# Patient Record
Sex: Male | Born: 1980 | Race: Black or African American | Hispanic: No | Marital: Single | Smoking: Current some day smoker
Health system: Southern US, Community
[De-identification: ages and names within clinical notes are randomized; demographics above are authoritative.]

## PROBLEM LIST (undated history)

## (undated) DIAGNOSIS — K5720 Diverticulitis of large intestine with perforation and abscess without bleeding: Secondary | ICD-10-CM

## (undated) DIAGNOSIS — Z91199 Patient's noncompliance with other medical treatment and regimen due to unspecified reason: Secondary | ICD-10-CM

## (undated) DIAGNOSIS — F1411 Cocaine abuse, in remission: Secondary | ICD-10-CM

## (undated) DIAGNOSIS — W3400XA Accidental discharge from unspecified firearms or gun, initial encounter: Secondary | ICD-10-CM

## (undated) DIAGNOSIS — Z9119 Patient's noncompliance with other medical treatment and regimen: Secondary | ICD-10-CM

## (undated) HISTORY — PX: NO PAST SURGERIES: SHX2092

---

## 2004-12-18 ENCOUNTER — Emergency Department: Payer: Self-pay | Admitting: Unknown Physician Specialty

## 2012-03-06 ENCOUNTER — Emergency Department: Payer: Self-pay | Admitting: Emergency Medicine

## 2013-02-19 ENCOUNTER — Emergency Department: Payer: Self-pay | Admitting: Emergency Medicine

## 2013-03-23 ENCOUNTER — Emergency Department: Payer: Self-pay | Admitting: Emergency Medicine

## 2015-06-02 ENCOUNTER — Emergency Department
Admission: EM | Admit: 2015-06-02 | Discharge: 2015-06-02 | Discharge status: Against Medical Advice | Payer: Self-pay | Attending: Student | Admitting: Student

## 2015-06-02 ENCOUNTER — Encounter: Payer: Self-pay | Admitting: Emergency Medicine

## 2015-06-02 DIAGNOSIS — R42 Dizziness and giddiness: Secondary | ICD-10-CM | POA: Insufficient documentation

## 2015-06-02 DIAGNOSIS — R1030 Lower abdominal pain, unspecified: Secondary | ICD-10-CM | POA: Insufficient documentation

## 2015-06-02 DIAGNOSIS — Z720 Tobacco use: Secondary | ICD-10-CM | POA: Insufficient documentation

## 2015-06-02 LAB — URINALYSIS COMPLETE WITH MICROSCOPIC (ARMC ONLY)
Bacteria, UA: NONE SEEN
Bilirubin Urine: NEGATIVE
Glucose, UA: NEGATIVE mg/dL
KETONES UR: NEGATIVE mg/dL
Leukocytes, UA: NEGATIVE
Nitrite: NEGATIVE
PH: 6 (ref 5.0–8.0)
PROTEIN: NEGATIVE mg/dL
SPECIFIC GRAVITY, URINE: 1.025 (ref 1.005–1.030)
Squamous Epithelial / LPF: NONE SEEN

## 2015-06-02 LAB — CBC WITH DIFFERENTIAL/PLATELET
Basophils Absolute: 0 10*3/uL (ref 0–0.1)
Basophils Relative: 0 %
EOS ABS: 0.1 10*3/uL (ref 0–0.7)
Eosinophils Relative: 1 %
HEMATOCRIT: 45.9 % (ref 40.0–52.0)
HEMOGLOBIN: 15.4 g/dL (ref 13.0–18.0)
LYMPHS ABS: 1.4 10*3/uL (ref 1.0–3.6)
LYMPHS PCT: 10 %
MCH: 29.3 pg (ref 26.0–34.0)
MCHC: 33.5 g/dL (ref 32.0–36.0)
MCV: 87.3 fL (ref 80.0–100.0)
Monocytes Absolute: 1.2 10*3/uL — ABNORMAL HIGH (ref 0.2–1.0)
Monocytes Relative: 9 %
NEUTROS ABS: 11.3 10*3/uL — AB (ref 1.4–6.5)
NEUTROS PCT: 80 %
Platelets: 191 10*3/uL (ref 150–440)
RBC: 5.25 MIL/uL (ref 4.40–5.90)
RDW: 13.3 % (ref 11.5–14.5)
WBC: 14.1 10*3/uL — AB (ref 3.8–10.6)

## 2015-06-02 LAB — BASIC METABOLIC PANEL
Anion gap: 5 (ref 5–15)
BUN: 15 mg/dL (ref 6–20)
CHLORIDE: 103 mmol/L (ref 101–111)
CO2: 28 mmol/L (ref 22–32)
Calcium: 8.9 mg/dL (ref 8.9–10.3)
Creatinine, Ser: 1.41 mg/dL — ABNORMAL HIGH (ref 0.61–1.24)
GFR calc Af Amer: >60 mL/min (ref >60)
GFR calc non Af Amer: >60 mL/min (ref >60)
Glucose, Bld: 98 mg/dL (ref 65–99)
POTASSIUM: 3.4 mmol/L — AB (ref 3.5–5.1)
SODIUM: 136 mmol/L (ref 135–145)

## 2015-06-02 NOTE — ED Notes (Signed)
Pt presents ED with c/o lower abdominal pain since last night associated with light headedness. Pt denies nausea/vomiting or dark/bloody stool.

## 2015-06-04 ENCOUNTER — Emergency Department
Admission: EM | Admit: 2015-06-04 | Discharge: 2015-06-04 | Disposition: A | Discharge status: Home / Self Care | Payer: Self-pay | Attending: Emergency Medicine | Admitting: Emergency Medicine

## 2015-06-04 ENCOUNTER — Telehealth: Payer: Self-pay | Admitting: Emergency Medicine

## 2015-06-04 ENCOUNTER — Encounter: Payer: Self-pay | Admitting: Emergency Medicine

## 2015-06-04 ENCOUNTER — Emergency Department: Payer: Self-pay

## 2015-06-04 DIAGNOSIS — R509 Fever, unspecified: Secondary | ICD-10-CM | POA: Insufficient documentation

## 2015-06-04 DIAGNOSIS — Z720 Tobacco use: Secondary | ICD-10-CM | POA: Insufficient documentation

## 2015-06-04 DIAGNOSIS — K5900 Constipation, unspecified: Secondary | ICD-10-CM | POA: Insufficient documentation

## 2015-06-04 LAB — CBC WITH DIFFERENTIAL/PLATELET
BASOS ABS: 0 10*3/uL (ref 0–0.1)
Basophils Relative: 0 %
EOS PCT: 1 %
Eosinophils Absolute: 0.1 10*3/uL (ref 0–0.7)
HCT: 45 % (ref 40.0–52.0)
Hemoglobin: 15 g/dL (ref 13.0–18.0)
Lymphocytes Relative: 3 %
Lymphs Abs: 0.3 10*3/uL — ABNORMAL LOW (ref 1.0–3.6)
MCH: 29.1 pg (ref 26.0–34.0)
MCHC: 33.4 g/dL (ref 32.0–36.0)
MCV: 87.3 fL (ref 80.0–100.0)
Monocytes Absolute: 0.4 10*3/uL (ref 0.2–1.0)
Monocytes Relative: 4 %
NEUTROS ABS: 9.8 10*3/uL — AB (ref 1.4–6.5)
Neutrophils Relative %: 92 %
PLATELETS: 163 10*3/uL (ref 150–440)
RBC: 5.16 MIL/uL (ref 4.40–5.90)
RDW: 13.5 % (ref 11.5–14.5)
WBC: 10.6 10*3/uL (ref 3.8–10.6)

## 2015-06-04 LAB — COMPREHENSIVE METABOLIC PANEL
ALK PHOS: 45 U/L (ref 38–126)
ALT: 28 U/L (ref 17–63)
AST: 23 U/L (ref 15–41)
Albumin: 3.8 g/dL (ref 3.5–5.0)
Anion gap: 5 (ref 5–15)
BUN: 13 mg/dL (ref 6–20)
CALCIUM: 8.7 mg/dL — AB (ref 8.9–10.3)
CHLORIDE: 104 mmol/L (ref 101–111)
CO2: 28 mmol/L (ref 22–32)
CREATININE: 1.45 mg/dL — AB (ref 0.61–1.24)
GFR calc Af Amer: >60 mL/min (ref >60)
GFR calc non Af Amer: >60 mL/min (ref >60)
GLUCOSE: 103 mg/dL — AB (ref 65–99)
Potassium: 3.7 mmol/L (ref 3.5–5.1)
SODIUM: 137 mmol/L (ref 135–145)
Total Bilirubin: 0.8 mg/dL (ref 0.3–1.2)
Total Protein: 7.4 g/dL (ref 6.5–8.1)

## 2015-06-04 MED ORDER — BISACODYL 5 MG PO TBEC: 5.0000 mg | DELAYED_RELEASE_TABLET | Freq: Every day | ORAL | Status: DC | PRN

## 2015-06-04 MED ORDER — IBUPROFEN 800 MG PO TABS
800.0000 mg | ORAL_TABLET | Freq: Once | ORAL | Status: AC
  Administered 2015-06-04: 800 mg via ORAL
  Filled 2015-06-04: qty 1

## 2015-06-04 MED ORDER — SODIUM CHLORIDE 0.9 % IV BOLUS (SEPSIS)
1000.0000 mL | Freq: Once | INTRAVENOUS | Status: AC
  Administered 2015-06-04: 1000 mL via INTRAVENOUS

## 2015-06-04 NOTE — ED Notes (Signed)
AAOx3.  Skin warm and dry.  NAD.  D/C home 

## 2015-06-04 NOTE — ED Notes (Signed)
Called patient due to lwot to inquire about condition and follow up plans. Patient says he feels some better today, but says he still has soreness in abdomen and was thinking to return.  He has no pcp. i told him he can return for exam.

## 2015-06-04 NOTE — ED Notes (Addendum)
Pt to ed with c/o fever and body aches.  Pt states he was seen here Monday night for abd pain and fever states left before being seen.  Pt states went home and ha felt increasingly worse. Denies abd pain today. Denies vomiting, denies nausea, denies diarrhea.

## 2015-06-04 NOTE — Discharge Instructions (Signed)

## 2015-06-04 NOTE — ED Provider Notes (Signed)
[facility] Emergency Department Provider Note  ____________________________________________  Time seen: Approximately 3:35 PM  I have reviewed the triage vital signs and the nursing notes.   HISTORY  Chief Complaint Generalized Body Aches    HPI Adam Pugh is a 34 y.o. male who presents to the emergency department complaining of abdominal pain and fever. He states that he isn't to the emergency department 2 days prior for same complaints but due to weight times he left without being seen. He states that symptoms have continued over the past 2 days. Eats that the only medication he has taken for same has been DayQuil. He reports no relief with same. He denies any headache, neck pain, nasal congestion, sore throat, chest pain, shortness of breath, nausea, vomiting, diarrhea. He does endorse abdominal pain that has "traveled around in my abdomen. And some constipation. He states that pain initially was in his right lower quadrant that it is now primarily in his left lower quadrant. He denies any urinary symptoms of frequency, burning with urination, hematuria. Denies any blood in his stools. He states that he did have a bowel movement today but previously has not had one for "4-5 days".Patient states that he has been able to eat and drink as normal.   History reviewed. No pertinent past medical history.  There are no active problems to display for this patient.   History reviewed. No pertinent past surgical history.  Current Outpatient Rx  Name  Route  Sig  Dispense  Refill  . bisacodyl (DULCOLAX) 5 MG EC tablet   Oral   Take 1 tablet (5 mg total) by mouth daily as needed for moderate constipation.   30 tablet   0     Allergies Review of patient's allergies indicates no known allergies.  History reviewed. No pertinent family history.  Social History Social History  Substance Use Topics  . Smoking status: Current Every Day Smoker -- 0.25  packs/day    Types: Cigarettes  . Smokeless tobacco: Never Used  . Alcohol Use: Yes     Comment: occ    Review of Systems Constitutional: No fever/chills Eyes: No visual changes. ENT: No sore throat. Cardiovascular: Denies chest pain. Respiratory: Denies shortness of breath. Gastrointestinal: Endorses abdominal pain.  No nausea, no vomiting.  No diarrhea.  Endorses constipation. Genitourinary: Negative for dysuria. Musculoskeletal: Negative for back pain. Skin: Negative for rash. Neurological: Negative for headaches, focal weakness or numbness.  10-point ROS otherwise negative.  ____________________________________________   PHYSICAL EXAM:  VITAL SIGNS: ED Triage Vitals  Enc Vitals Group     BP 06/04/15 1450 130/79 mmHg     Pulse Rate 06/04/15 1450 107     Resp 06/04/15 1450 20     Temp 06/04/15 1450 101.8 F (38.8 C)     Temp Source 06/04/15 1450 Oral     SpO2 06/04/15 1450 100 %     Weight 06/04/15 1450 220 lb (99.791 kg)     Height 06/04/15 1450  (1.88 m)     Head Cir --      Peak Flow --      Pain Score 06/04/15 1525 3     Pain Loc --      Pain Edu? --      Excl. in GC? --     Constitutional: Alert and oriented. Well appearing and in no acute distress. Eyes: Conjunctivae are normal. PERRL. EOMI. Head: Atraumatic. Nose: No congestion/rhinnorhea. Mouth/Throat: Mucous membranes are moist.  Oropharynx  non-erythematous. Neck: No stridor.   Hematological/Lymphatic/Immunilogical: No cervical lymphadenopathy. Cardiovascular: Normal rate, regular rhythm. Grossly normal heart sounds.  Good peripheral circulation. Respiratory: Normal respiratory effort.  No retractions. Lungs CTAB. Gastrointestinal: Soft to palpation. Patient is tender to palpation left lower quadrant but is nontender to palpation of other quadrants. No tenderness to palpation in suprapubic region. No rigidity or guarding. No distention. No abdominal bruits. No CVA tenderness. Musculoskeletal: No  lower extremity tenderness nor edema.  No joint effusions. Neurologic:  Normal speech and language. No gross focal neurologic deficits are appreciated. No gait instability. Skin:  Skin is warm, dry and intact. No rash noted. Psychiatric: Mood and affect are normal. Speech and behavior are normal.  ____________________________________________   LABS (all labs ordered are listed, but only abnormal results are displayed)  Labs Reviewed  COMPREHENSIVE METABOLIC PANEL - Abnormal; Notable for the following:    Glucose, Bld 103 (*)    Creatinine, Ser 1.45 (*)    Calcium 8.7 (*)    All other components within normal limits  CBC WITH DIFFERENTIAL/PLATELET - Abnormal; Notable for the following:    Neutro Abs 9.8 (*)    Lymphs Abs 0.3 (*)    All other components within normal limits   ____________________________________________  EKG   ____________________________________________  RADIOLOGY  Abdominal x-ray Impression: No definitive obstruction identified. A few small air-fluid levels were noted within the colon and small bowel. Mild amount of fecal material is seen. ____________________________________________   PROCEDURES  Procedure(s) performed: None  Critical Care performed: No  ____________________________________________   INITIAL IMPRESSION / ASSESSMENT AND PLAN / ED COURSE  Pertinent labs & imaging results that were available during my care of the patient were reviewed by me and considered in my medical decision making (see chart for details).  The patient is a 34 year old male who presents to the emergency department complaining of abdominal pain. States that symptoms have persisted for 4 days and the pain is transient through his abdomen. Initially pain was in right lower quadrant and now the pain is in his left lower quadrant. He does endorse moderate constipation over the last several days, though he did have a bowel movement this morning. He reports a mild fever but  no other symptoms. Exam revealed tenderness to palpation over left lower quadrant. Patient was given ibuprofen, IV fluids for symptomatic relief. Repeat vital signs showed improvement after administration.. Labs returned without evidence of elevated white blood cell count. Abdominal x-ray did not reveal any obvious obstruction and there is a moderate amount of retained fecal material. Discussed findings with the patient. At this time I will not pursue a CT of his abdomen or pelvis and the patient agrees with same. Advised the patient to take laxatives for improvement of his constipation. Tylenol and ibuprofen for additional pain relief and any return of fever. ____________________________________________   FINAL CLINICAL IMPRESSION(S) / ED DIAGNOSES  Final diagnoses:  Constipation, unspecified constipation type      Racheal Patches, PA-C 06/04/15 1743  Arnaldo Natal, MD 06/05/15 [phone]

## 2015-08-03 ENCOUNTER — Emergency Department: Payer: Medicaid Other

## 2015-08-03 ENCOUNTER — Inpatient Hospital Stay
Admission: EM | Admit: 2015-08-03 | Discharge: 2015-08-08 | DRG: 386 | Disposition: A | Discharge status: Home Health | Payer: Medicaid Other | Attending: Internal Medicine | Admitting: Internal Medicine

## 2015-08-03 ENCOUNTER — Encounter: Payer: Self-pay | Admitting: Emergency Medicine

## 2015-08-03 DIAGNOSIS — K50913 Crohn's disease, unspecified, with fistula: Principal | ICD-10-CM | POA: Diagnosis present

## 2015-08-03 DIAGNOSIS — F1210 Cannabis abuse, uncomplicated: Secondary | ICD-10-CM | POA: Diagnosis present

## 2015-08-03 DIAGNOSIS — K5792 Diverticulitis of intestine, part unspecified, without perforation or abscess without bleeding: Secondary | ICD-10-CM

## 2015-08-03 DIAGNOSIS — N419 Inflammatory disease of prostate, unspecified: Secondary | ICD-10-CM | POA: Diagnosis present

## 2015-08-03 DIAGNOSIS — F1410 Cocaine abuse, uncomplicated: Secondary | ICD-10-CM | POA: Diagnosis present

## 2015-08-03 DIAGNOSIS — R1030 Lower abdominal pain, unspecified: Secondary | ICD-10-CM | POA: Insufficient documentation

## 2015-08-03 DIAGNOSIS — Z79899 Other long term (current) drug therapy: Secondary | ICD-10-CM | POA: Diagnosis not present

## 2015-08-03 DIAGNOSIS — R300 Dysuria: Secondary | ICD-10-CM | POA: Diagnosis not present

## 2015-08-03 DIAGNOSIS — F17210 Nicotine dependence, cigarettes, uncomplicated: Secondary | ICD-10-CM | POA: Diagnosis present

## 2015-08-03 DIAGNOSIS — K5720 Diverticulitis of large intestine with perforation and abscess without bleeding: Secondary | ICD-10-CM | POA: Diagnosis present

## 2015-08-03 DIAGNOSIS — E876 Hypokalemia: Secondary | ICD-10-CM | POA: Diagnosis not present

## 2015-08-03 LAB — COMPREHENSIVE METABOLIC PANEL
ALBUMIN: 4.2 g/dL (ref 3.5–5.0)
ALK PHOS: 48 U/L (ref 38–126)
ALT: 19 U/L (ref 17–63)
ANION GAP: 9 (ref 5–15)
AST: 16 U/L (ref 15–41)
BUN: 23 mg/dL — ABNORMAL HIGH (ref 6–20)
CALCIUM: 9 mg/dL (ref 8.9–10.3)
CO2: 23 mmol/L (ref 22–32)
Chloride: 105 mmol/L (ref 101–111)
Creatinine, Ser: 1.35 mg/dL — ABNORMAL HIGH (ref 0.61–1.24)
GFR calc non Af Amer: >60 mL/min (ref >60)
GLUCOSE: 159 mg/dL — AB (ref 65–99)
POTASSIUM: 4 mmol/L (ref 3.5–5.1)
SODIUM: 137 mmol/L (ref 135–145)
Total Bilirubin: 0.5 mg/dL (ref 0.3–1.2)
Total Protein: 7.4 g/dL (ref 6.5–8.1)

## 2015-08-03 LAB — URINALYSIS COMPLETE WITH MICROSCOPIC (ARMC ONLY)
BACTERIA UA: NONE SEEN
BILIRUBIN URINE: NEGATIVE
GLUCOSE, UA: NEGATIVE mg/dL
Ketones, ur: NEGATIVE mg/dL
Leukocytes, UA: NEGATIVE
NITRITE: NEGATIVE
PH: 6 (ref 5.0–8.0)
Protein, ur: NEGATIVE mg/dL
SPECIFIC GRAVITY, URINE: 1.03 (ref 1.005–1.030)
Squamous Epithelial / LPF: NONE SEEN

## 2015-08-03 LAB — CBC
HEMATOCRIT: 44.8 % (ref 40.0–52.0)
HEMOGLOBIN: 14.7 g/dL (ref 13.0–18.0)
MCH: 28.2 pg (ref 26.0–34.0)
MCHC: 32.7 g/dL (ref 32.0–36.0)
MCV: 86.2 fL (ref 80.0–100.0)
Platelets: 217 10*3/uL (ref 150–440)
RBC: 5.2 MIL/uL (ref 4.40–5.90)
RDW: 14.1 % (ref 11.5–14.5)
WBC: 18.2 10*3/uL — ABNORMAL HIGH (ref 3.8–10.6)

## 2015-08-03 LAB — CK: Total CK: 177 U/L (ref 49–397)

## 2015-08-03 LAB — TROPONIN I: Troponin I: <0.03 ng/mL (ref <0.031)

## 2015-08-03 LAB — LIPASE, BLOOD: LIPASE: 24 U/L (ref 11–51)

## 2015-08-03 LAB — C-REACTIVE PROTEIN: CRP: 0.8 mg/dL (ref <1.0)

## 2015-08-03 LAB — SEDIMENTATION RATE: SED RATE: 4 mm/h (ref 0–15)

## 2015-08-03 MED ORDER — ENOXAPARIN SODIUM 40 MG/0.4ML ~~LOC~~ SOLN
40.0000 mg | SUBCUTANEOUS | Status: DC
  Administered 2015-08-03 – 2015-08-07 (×5): 40 mg via SUBCUTANEOUS
  Filled 2015-08-03 (×5): qty 0.4

## 2015-08-03 MED ORDER — KETOROLAC TROMETHAMINE 30 MG/ML IJ SOLN
30.0000 mg | Freq: Once | INTRAMUSCULAR | Status: DC
  Filled 2015-08-03 (×2): qty 1

## 2015-08-03 MED ORDER — IOHEXOL 300 MG/ML  SOLN
125.0000 mL | Freq: Once | INTRAMUSCULAR | Status: AC | PRN
  Administered 2015-08-03: 125 mL via INTRAVENOUS

## 2015-08-03 MED ORDER — ACETAMINOPHEN 650 MG RE SUPP: 650.0000 mg | Freq: Four times a day (QID) | RECTAL | Status: DC | PRN

## 2015-08-03 MED ORDER — MORPHINE SULFATE (PF) 2 MG/ML IV SOLN
2.0000 mg | INTRAVENOUS | Status: DC | PRN
  Administered 2015-08-03 (×2): 2 mg via INTRAVENOUS
  Filled 2015-08-03 (×2): qty 1

## 2015-08-03 MED ORDER — ONDANSETRON HCL 4 MG/2ML IJ SOLN
4.0000 mg | Freq: Once | INTRAMUSCULAR | Status: AC
  Administered 2015-08-03: 4 mg via INTRAVENOUS
  Filled 2015-08-03: qty 2

## 2015-08-03 MED ORDER — SODIUM CHLORIDE 0.9 % IV BOLUS (SEPSIS)
1000.0000 mL | Freq: Once | INTRAVENOUS | Status: AC
  Administered 2015-08-03: 1000 mL via INTRAVENOUS

## 2015-08-03 MED ORDER — ONDANSETRON HCL 4 MG PO TABS: 4.0000 mg | ORAL_TABLET | Freq: Four times a day (QID) | ORAL | Status: DC | PRN

## 2015-08-03 MED ORDER — SODIUM CHLORIDE 0.9 % IV SOLN
INTRAVENOUS | Status: DC
Start: 2015-08-03 — End: 2015-08-07
  Administered 2015-08-03 – 2015-08-07 (×7): via INTRAVENOUS

## 2015-08-03 MED ORDER — IOHEXOL 240 MG/ML SOLN
25.0000 mL | Freq: Once | INTRAMUSCULAR | Status: AC | PRN
  Administered 2015-08-03: 25 mL via ORAL

## 2015-08-03 MED ORDER — ONDANSETRON HCL 4 MG/2ML IJ SOLN: 4.0000 mg | Freq: Four times a day (QID) | INTRAMUSCULAR | Status: DC | PRN

## 2015-08-03 MED ORDER — PIPERACILLIN-TAZOBACTAM 3.375 G IVPB
3.3750 g | Freq: Three times a day (TID) | INTRAVENOUS | Status: DC
  Administered 2015-08-03 – 2015-08-07 (×11): 3.375 g via INTRAVENOUS
  Filled 2015-08-03 (×14): qty 50

## 2015-08-03 MED ORDER — HYDROMORPHONE HCL 1 MG/ML IJ SOLN
1.0000 mg | Freq: Once | INTRAMUSCULAR | Status: AC
  Administered 2015-08-03: 1 mg via INTRAVENOUS
  Filled 2015-08-03: qty 1

## 2015-08-03 MED ORDER — MORPHINE SULFATE (PF) 4 MG/ML IV SOLN
4.0000 mg | INTRAVENOUS | Status: DC | PRN
  Administered 2015-08-04: 4 mg via INTRAVENOUS
  Filled 2015-08-03: qty 1

## 2015-08-03 MED ORDER — PIPERACILLIN-TAZOBACTAM 3.375 G IVPB
3.3750 g | Freq: Once | INTRAVENOUS | Status: AC
  Administered 2015-08-03: 3.375 g via INTRAVENOUS
  Filled 2015-08-03: qty 50

## 2015-08-03 MED ORDER — MORPHINE SULFATE (PF) 4 MG/ML IV SOLN
4.0000 mg | Freq: Once | INTRAVENOUS | Status: AC
  Administered 2015-08-03: 4 mg via INTRAVENOUS
  Filled 2015-08-03: qty 1

## 2015-08-03 MED ORDER — NICOTINE 10 MG IN INHA
1.0000 | RESPIRATORY_TRACT | Status: DC | PRN
  Filled 2015-08-03: qty 36

## 2015-08-03 MED ORDER — ACETAMINOPHEN 325 MG PO TABS
650.0000 mg | ORAL_TABLET | Freq: Four times a day (QID) | ORAL | Status: DC | PRN
  Administered 2015-08-05: 650 mg via ORAL
  Filled 2015-08-03: qty 2

## 2015-08-03 NOTE — ED Notes (Signed)
Patient transported to CT 

## 2015-08-03 NOTE — H&P (Signed)
[facility] [place] [facility]   PATIENT NAME: Adam Pugh    MR#:  [id number]  DATE OF BIRTH:  1981/01/27  DATE OF ADMISSION:  08/03/2015  PRIMARY CARE PHYSICIAN: No PCP Per Patient   REQUESTING/REFERRING PHYSICIAN: Dr. Sharyn Creamer  CHIEF COMPLAINT:   Chief Complaint  Patient presents with  . Abdominal Pain    HISTORY OF PRESENT ILLNESS:  Adam Pugh  is a 34 y.o. male with no known past medical history who presented to the hospital complaining of abdominal pain that began earlier this morning. Patient describes his abdominal pain in the lower part of his abdomen showed and crampy in nature. He associates this with some ongoing nausea but no acute vomiting. He denies diarrhea, hematochezia, melena, fever, chills or any sick contacts. Patient says he developed similar symptoms about a month and a half ago and he came to the ER but his symptoms this time are much severe.  Patient presented to the emergency room and underwent a CT scan of his abdomen pelvis which showed evidence of sigmoid diverticulitis/microperforation with fistula formation. Hospitalist services were contacted for further treatment and evaluation.  PAST MEDICAL HISTORY:  History reviewed. No pertinent past medical history.  PAST SURGICAL HISTORY:  History reviewed. No pertinent past surgical history.  SOCIAL HISTORY:   Social History  Substance Use Topics  . Smoking status: Current Every Day Smoker -- 0.25 packs/day    Types: Cigarettes  . Smokeless tobacco: Never Used  . Alcohol Use: Yes     Comment: occ    FAMILY HISTORY:   Family History  Problem Relation Age of Onset  . Glaucoma Father   . Diabetes Other     DRUG ALLERGIES:  No Known Allergies  REVIEW OF SYSTEMS:   Review of Systems  Constitutional: Negative for fever and weight loss.  HENT: Negative for congestion, nosebleeds and tinnitus.   Eyes: Negative for blurred vision, double vision and redness.   Respiratory: Negative for cough, hemoptysis and shortness of breath.   Cardiovascular: Negative for chest pain, orthopnea, leg swelling and PND.  Gastrointestinal: Positive for nausea and abdominal pain. Negative for vomiting, diarrhea and melena.  Genitourinary: Negative for dysuria, urgency and hematuria.  Musculoskeletal: Negative for joint pain and falls.  Neurological: Negative for dizziness, tingling, sensory change, focal weakness, seizures, weakness and headaches.  Endo/Heme/Allergies: Negative for polydipsia. Does not bruise/bleed easily.  Psychiatric/Behavioral: Negative for depression and memory loss. The patient is not nervous/anxious.     MEDICATIONS AT HOME:   Prior to Admission medications   Medication Sig Start Date End Date Taking? Authorizing Provider  bisacodyl (DULCOLAX) 5 MG EC tablet Take 1 tablet (5 mg total) by mouth daily as needed for moderate constipation. 06/04/15 06/03/16  Christiane Ha D Cuthriell, PA-C      VITAL SIGNS:  Height  (1.88 m), weight 104.327 kg (230 lb), SpO2 96 %.  PHYSICAL EXAMINATION:  Physical Exam  GENERAL:  34 y.o.-year-old patient lying in the bed in mild to moderate distress.  EYES: Pupils equal, round, reactive to light and accommodation. No scleral icterus. Extraocular muscles intact.  HEENT: Head atraumatic, normocephalic. Oropharynx and nasopharynx clear. No oropharyngeal erythema, moist oral mucosa  NECK:  Supple, no jugular venous distention. No thyroid enlargement, no tenderness.  LUNGS: Normal breath sounds bilaterally, no wheezing, rales, rhonchi. No use of accessory muscles of respiration.  CARDIOVASCULAR: S1, S2 RRR. No murmurs, rubs, gallops, clicks.  ABDOMEN: Soft, tender in the right lower and  left lower quadrant. No rebound, rigidity. Hypoactive Bowel sounds present. No organomegaly or mass.  EXTREMITIES: No pedal edema, cyanosis, or clubbing. + 2 pedal & radial pulses b/l.   NEUROLOGIC: Cranial nerves II through XII  are intact. No focal Motor or sensory deficits appreciated b/l PSYCHIATRIC: The patient is alert and oriented x 3. Good affect.  SKIN: No obvious rash, lesion, or ulcer.   LABORATORY PANEL:   CBC  Recent Labs Lab 08/03/15 1019  WBC 18.2*  HGB 14.7  HCT 44.8  PLT 217   ------------------------------------------------------------------------------------------------------------------  Chemistries   Recent Labs Lab 08/03/15 1019  NA 137  K 4.0  CL 105  CO2 23  GLUCOSE 159*  BUN 23*  CREATININE 1.35*  CALCIUM 9.0  AST 16  ALT 19  ALKPHOS 48  BILITOT 0.5   ------------------------------------------------------------------------------------------------------------------  Cardiac Enzymes No results for input(s): TROPONINI in the last 168 hours. ------------------------------------------------------------------------------------------------------------------  RADIOLOGY:  Ct Abdomen Pelvis W Contrast  08/03/2015  CLINICAL DATA:  Mid to lower right abdominal pain. EXAM: CT ABDOMEN AND PELVIS WITH CONTRAST TECHNIQUE: Multidetector CT imaging of the abdomen and pelvis was performed using the standard protocol following bolus administration of intravenous contrast. CONTRAST:  OMNIPAQUE IOHEXOL 300 MG/ML  SOLN COMPARISON:  No prior CT abdomen/ pelvis. 06/04/2015 abdominal radiographs. FINDINGS: Lower chest: No significant pulmonary nodules or acute consolidative airspace disease. Hepatobiliary: Normal liver with no liver mass. Normal gallbladder with no radiopaque cholelithiasis. No biliary ductal dilatation. Pancreas: Normal, with no mass or duct dilation. Spleen: Normal size. No mass. Adrenals/Urinary Tract: Normal adrenals. There a few hypodense subcentimeter renal cortical lesions in both kidneys, too small to characterize. No hydronephrosis. Normal bladder. Stomach/Bowel: Grossly normal stomach. There is severe wall thickening and hyperenhancement with prominent pericolonic  fat stranding involving the mid to distal sigmoid colon. There is underlying mild sigmoid diverticulosis. There are 2 small bowel loops in the anterior pelvis in close proximity to the inflamed sigmoid colon, both of which demonstrate prominent small bowel wall thickening and hyperenhancement and prominent surrounding fat stranding and ill-defined fluid. There is a suggestion of a hyperenhancing curvilinear tract with internal gas between the sigmoid colon and 1 of the inflamed pelvic small bowel loops, which could represent a colo-enteric fistula. There is a tiny focus of extraluminal gas in the anterior pelvis. No drainable fluid collection is seen. The appendix is normal. There is no significant small bowel dilatation. Vascular/Lymphatic: Normal caliber abdominal aorta. Patent portal, splenic, hepatic and renal veins. No pathologically enlarged lymph nodes in the abdomen or pelvis. Reproductive: Normal size prostate and seminal vesicles. Other: Trace free fluid in the prerectal pelvic space. Musculoskeletal: No aggressive appearing focal osseous lesions. Mild degenerative changes in the lower thoracic spine. IMPRESSION: 1. Prominent wall thickening, wall hyperenhancement and pericolonic fat stranding in the mid to distal sigmoid colon, in association with underlying mild sigmoid diverticulosis. Two adjacent pelvic small bowel loops demonstrate severe wall thickening and surrounding inflammatory fat stranding and fluid. 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 Tiny focus of pneumoperitoneum, with no focal drainable fluid collection. Differential includes Crohn disease versus perforated sigmoid diverticulitis with fistula formation. An underlying neoplasm is less likely but not entirely excluded. Surgical consultation is advised. 2. Normal appendix. Critical Value/emergent results were called by telephone at the time of interpretation on 08/03/2015 at  12:24 pm to Dr. Sharyn Creamer , who verbally acknowledged these results. Electronically Signed   By: Barbara Cower  A Poff M.D.   On: 08/03/2015 12:26     IMPRESSION AND PLAN:   34 year old male with no significant past medical history who presented to the hospital with abdominal pain and nausea and noted to have CT scan findings consistent with acute diverticulitis/microperforation with fistula formation.  #1 acute diverticulitis-this is the cause of patient's abdominal pain and nausea. -Supportive care with IV fluids, pain control, antiemetics. -I will also start the patient on IV Zosyn and follow clinically. -Patient's CT scan is also suggestive of possible Crohn's disease and therefore I will get a Gastroenterology consult. -Nothing by mouth except ice chips and meds.  #2 microperforation with fistula formation -the ER physician discussed these findings with the surgeon on call who did not recommend acute surgical intervention presently. -I will continue supportive care as mentioned above. I will go ahead and consult general surgery and have them follow along.  #3 leukocytosis-likely secondary to the acute diverticulitis and we'll follow white cell count with IV antibiotic therapy.  #4 polysubstance abuse-patient snorted cocaine yesterday and does smoke marijuana occasionally. -Follow for signs of withdrawal.   All the records are reviewed and case discussed with ED provider. Management plans discussed with the patient, family and they are in agreement.  CODE STATUS: Full  TOTAL TIME TAKING CARE OF THIS PATIENT: 45 minutes.    Houston Siren M.D on 08/03/2015 at 12:52 PM  Between 7am to 6pm - Pager - [phone]  After 6pm go to [email or web address] - password [email or web address] [facility]  [facility] [place] [facility]  Office  [phone]  CC: Primary care physician; No PCP Per Patient

## 2015-08-03 NOTE — Progress Notes (Signed)
Dr. Anne Hahn notified of pt requesting for nicotrol inhalers. Pt smokes 1/2 pack/day. MD placed order.

## 2015-08-03 NOTE — Consult Note (Signed)
Patient ID: Adam Pugh, male   DOB: 12-08-1980, 34 y.o.   MRN: [id number]  CC: ABDOMINAL PAIN  HPI Adam Pugh is a 33 y.o. male admitted to the medicine service for treatment of abdominal pain. Patient is without any significant medical history who had a sudden onset of abdominal pain earlier today. The pain was lower in his abdomen and described as crampy. It is coming in waves and will shoot through him across his lower abdomen. He's had associated nausea without vomiting for this. He did have subjective fevers and sweats at home this morning. He does state that he had something similar to this about 6 weeks ago but that is not as severe at that time. His last BM was yesterday and he reports that as being normal. He denies any chest pain, shortness of breath, anorexia, fatigue, or blood per rectum. Otherwise healthy.  HPI  History reviewed. No pertinent past medical history.  History reviewed. No pertinent past surgical history.  Family History  Problem Relation Age of Onset  . Glaucoma Father   . Diabetes Other     Social History Social History  Substance Use Topics  . Smoking status: Current Every Day Smoker -- 0.25 packs/day    Types: Cigarettes  . Smokeless tobacco: Never Used  . Alcohol Use: Yes     Comment: occ    No Known Allergies  Current Facility-Administered Medications  Medication Dose Route Frequency Provider Last Rate Last Dose  . 0.9 %  sodium chloride infusion   Intravenous Continuous Houston Siren, MD 125 mL/hr at 08/03/15 1537    . acetaminophen (TYLENOL) tablet 650 mg  650 mg Oral Q6H PRN Houston Siren, MD       Or  . acetaminophen (TYLENOL) suppository 650 mg  650 mg Rectal Q6H PRN Houston Siren, MD      . enoxaparin (LOVENOX) injection 40 mg  40 mg Subcutaneous Q24H Houston Siren, MD   40 mg at 08/03/15 1537  . morphine 2 MG/ML injection 2 mg  2 mg Intravenous Q3H PRN Houston Siren, MD      . ondansetron (ZOFRAN) tablet 4 mg  4 mg  Oral Q6H PRN Houston Siren, MD       Or  . ondansetron (ZOFRAN) injection 4 mg  4 mg Intravenous Q6H PRN Houston Siren, MD      . piperacillin-tazobactam (ZOSYN) IVPB 3.375 g  3.375 g Intravenous Once Sharyn Creamer, MD 12.5 mL/hr at 08/03/15 1321 3.375 g at 08/03/15 1321  . piperacillin-tazobactam (ZOSYN) IVPB 3.375 g  3.375 g Intravenous 3 times per day Olene Floss, RPH         Review of Systems A multi-point review of systems was asked and was negative except for the positive findings in the HPI  Physical Exam Blood pressure 121/88, pulse 101, temperature 98.7 F (37.1 C), temperature source Oral, resp. rate 18, height 6\' 2"  (1.88 m), weight 104.327 kg (230 lb), SpO2 100 %. CONSTITUTIONAL: Resting in bed in no acute distress. EYES: Pupils are equal, round, and reactive to light, Sclera are non-icteric. EARS, NOSE, MOUTH AND THROAT: The oropharynx is clear. The oral mucosa is pink and moist. Hearing is intact to voice. LYMPH NODES:  Lymph nodes in the neck are normal. RESPIRATORY:  Lungs are clear. There is normal respiratory effort, with equal breath sounds bilaterally, and without pathologic use of accessory muscles. CARDIOVASCULAR: Heart is regular without murmurs, gallops, or rubs. GI:  The abdomen is slender, soft, tender to palpation in the lower midline, and nondistended. There are no palpable masses. There is no hepatosplenomegaly. There are normal bowel sounds in all quadrants. GU: Rectal deferred.   MUSCULOSKELETAL: Normal muscle strength and tone. No cyanosis or edema.   SKIN: Turgor is good and there are no pathologic skin lesions or ulcers. NEUROLOGIC: Motor and sensation is grossly normal. Cranial nerves are grossly intact. PSYCH:  Oriented to person, place and time. Affect is normal.  Data Reviewed I reviewed the patient's labs and images. Labs are concerning for leukocytosis of 18,000 images with concerns for either new-onset Crohn's disease or diverticulitis both  with microperforation possible fistula formation. I have personally reviewed the patient's imaging, laboratory findings and medical records.    Assessment    Inflammatory bowel disease    Plan    34 year old male with no other medical history with new onset inflammatory bowel disease. Difficult to ascertain as to whether not this is diverticulitis versus new-onset Crohn's. Agree with current medical management of IV antibiotics and bowel rest. Patient will need further GI workup. Should he worsen clinically, form an abscess or peritonitis then surgery may be required. Currently no evidence of peritonitis or indication for urgent surgical intervention. Surgery will continue to follow until he clinically improves.     Time spent with the patient was 40 minutes, with more than 50% of the time spent in face-to-face education, counseling and care coordination.     Ricarda Frame, MD FACS General Surgeon 08/03/2015, 4:26 PM

## 2015-08-03 NOTE — Consult Note (Signed)
ANTIBIOTIC CONSULT NOTE - INITIAL  Pharmacy Consult for zosyn Indication: diverticulitis/microperforation with fistula formation.  No Known Allergies  Patient Measurements: Height:  (188 cm) Weight: 230 lb (104.327 kg) IBW/kg (Calculated) : 82.2 Adjusted Body Weight:   Vital Signs: Temp: 98.7 F (37.1 C) (12/04 1345) Temp Source: Oral (12/04 1345) BP: 121/88 mmHg (12/04 1345) Pulse Rate: 101 (12/04 1345) Intake/Output from previous day:   Intake/Output from this shift: Total I/O In: -  Out: 500 [Urine:500]  Labs:  Recent Labs  08/03/15 1019  WBC 18.2*  HGB 14.7  PLT 217  CREATININE 1.35*   Estimated Creatinine Clearance: 101.1 mL/min (by C-G formula based on Cr of 1.35). No results for input(s): VANCOTROUGH, VANCOPEAK, VANCORANDOM, GENTTROUGH, GENTPEAK, GENTRANDOM, TOBRATROUGH, TOBRAPEAK, TOBRARND, AMIKACINPEAK, AMIKACINTROU, AMIKACIN in the last 72 hours.   Microbiology: No results found for this or any previous visit (from the past 720 hour(s)).  Medical History: History reviewed. No pertinent past medical history.  Medications:  Scheduled:  . enoxaparin (LOVENOX) injection  40 mg Subcutaneous Q24H  . piperacillin-tazobactam (ZOSYN)  IV  3.375 g Intravenous Once  . piperacillin-tazobactam (ZOSYN)  IV  3.375 g Intravenous 3 times per day   Assessment: Pt is a 34 year old male with diverticulitis/microperforation with fistula formation. Pharmacy consulted to dose zosyn   Goal of Therapy:  resolution of infection  Plan:  Follow up culture results zosyn 3.375g q 8 hours EI dosing. pharmacy to continue to monitor. thank you for the consult  Adam Pugh Adam Pugh 08/03/2015,2:12 PM

## 2015-08-03 NOTE — ED Notes (Addendum)
Pt arrived via EMS from. States the pain started around 0700 this morning.  Denies any history of abdominal pain or medical problems.  Pt has not ate since last night. Denies any N/V. No change in position makes things better or worse.  Denies back pain. Pt reports diaphoresis this morning when this started.

## 2015-08-03 NOTE — ED Provider Notes (Signed)
[facility] Emergency Department Provider Note  REMINDER - THIS NOTE IS NOT A FINAL MEDICAL RECORD UNTIL IT IS SIGNED. UNTIL THEN, THE CONTENT BELOW MAY REFLECT INFORMATION FROM A DOCUMENTATION TEMPLATE, NOT THE ACTUAL PATIENT VISIT. ____________________________________________  Time seen: Approximately 10:52 AM  I have reviewed the triage vital signs and the nursing notes.   HISTORY  Chief Complaint Abdominal Pain    HPI Adam Pugh is a 34 y.o. male with no previous medical history.  Patient reports about 3 hours ago while at home he developed sudden onset and has been having progressively increasing pain in the lower abdomen slightly more on the right than on the left. No nausea or vomiting. No fevers or chills. Nothing seems to make it better, it is made worse by bumps in the road.  Patient states is a sharp pain, worsening, and severe. Denies pain in his testicles or groin. No chest pain or trouble breathing. He does report that he used cocaine last night. He does not use heavy or large alcohol.   History reviewed. No pertinent past medical history.  Patient Active Problem List   Diagnosis Date Noted  . Diverticulitis 08/03/2015    History reviewed. No pertinent past surgical history.  No current outpatient prescriptions on file.  Allergies Review of patient's allergies indicates no known allergies.  Family History  Problem Relation Age of Onset  . Glaucoma Father   . Diabetes Other     Social History Social History  Substance Use Topics  . Smoking status: Current Every Day Smoker -- 0.25 packs/day    Types: Cigarettes  . Smokeless tobacco: Never Used  . Alcohol Use: Yes     Comment: occ    Review of Systems Constitutional: No fever/chills Eyes: No visual changes. ENT: No sore throat. Cardiovascular: Denies chest pain. Respiratory: Denies shortness of breath. Gastrointestinal: No nausea, no vomiting.  No diarrhea.  No  constipation. Genitourinary: Negative for dysuria. No blood in the urine. Musculoskeletal: Negative for back pain. Skin: Negative for rash. Neurological: Negative for headaches, focal weakness or numbness.  10-point ROS otherwise negative.  ____________________________________________   PHYSICAL EXAM:  VITAL SIGNS: ED Triage Vitals  Enc Vitals Group     BP --      Pulse --      Resp --      Temp --      Temp src --      SpO2 08/03/15 1011 96 %     Weight 08/03/15 1014 230 lb (104.327 kg)     Height 08/03/15 1014  (1.88 m)     Head Cir --      Peak Flow --      Pain Score 08/03/15 1014 10     Pain Loc --      Pain Edu? --      Excl. in GC? --    Constitutional: Alert and oriented. Well appearing but does appear to be in moderate discomfort, sitting still reporting that any movement will make his pain worse in the abdomen. Eyes: Conjunctivae are normal. PERRL. EOMI. Head: Atraumatic. Nose: No congestion/rhinnorhea. Mouth/Throat: Mucous membranes are moist.  Oropharynx non-erythematous. Neck: No stridor.   Cardiovascular: Normal rate, regular rhythm. Grossly normal heart sounds.  Good peripheral circulation. Respiratory: Normal respiratory effort.  No retractions. Lungs CTAB. Gastrointestinal: Mild tenderness throughout the abdomen, but focal and severe tenderness in the right lower abdomen with associated rebound and guarding. There is pain at McBurney's point. Negative  Eulah Pont. No distention. No abdominal bruits. No CVA tenderness. Normal testicles, nontender and descended bilaterally. Normal circumcised penis. No groin pain or mass. No evidence of groin hernia. Musculoskeletal: No lower extremity tenderness nor edema.  No joint effusions. Neurologic:  Normal speech and language. No gross focal neurologic deficits are appreciated. No gait instability. Skin:  Skin is warm, dry and intact. No rash noted. Psychiatric: Mood and affect are normal. Speech and behavior are  normal.  ____________________________________________   LABS (all labs ordered are listed, but only abnormal results are displayed)  Labs Reviewed  COMPREHENSIVE METABOLIC PANEL - Abnormal; Notable for the following:    Glucose, Bld 159 (*)    BUN 23 (*)    Creatinine, Ser 1.35 (*)    All other components within normal limits  CBC - Abnormal; Notable for the following:    WBC 18.2 (*)    All other components within normal limits  URINALYSIS COMPLETEWITH MICROSCOPIC ([facility] ONLY) - Abnormal; Notable for the following:    Color, Urine YELLOW (*)    APPearance CLEAR (*)    Hgb urine dipstick 2+ (*)    All other components within normal limits  CULTURE, BLOOD (ROUTINE X 2)  CULTURE, BLOOD (ROUTINE X 2)  LIPASE, BLOOD  CK  TROPONIN I  SEDIMENTATION RATE  C-REACTIVE PROTEIN   ____________________________________________  EKG  ED ECG REPORT I, QUALE, MARK, the attending physician, personally viewed and interpreted this ECG.  Date: 08/03/2015 EKG Time: 1050 Rate: 80 Rhythm: normal sinus rhythm QRS Axis: Probable LVH Intervals: normal ST/T Wave abnormalities: normal Conduction Disutrbances: none Narrative Interpretation: Normal sinus rhythm, possible LVH. No ischemic abdomen round  ____________________________________________  RADIOLOGY  CT Abdomen Pelvis W Contrast (Final result) Result time: 08/03/15 12:26:13   Final result by Rad Results In Interface (08/03/15 12:26:13)   Narrative:   CLINICAL DATA: Mid to lower right abdominal pain.  EXAM: CT ABDOMEN AND PELVIS WITH CONTRAST  TECHNIQUE: Multidetector CT imaging of the abdomen and pelvis was performed using the standard protocol following bolus administration of intravenous contrast.  CONTRAST: OMNIPAQUE IOHEXOL 300 MG/ML SOLN  COMPARISON: No prior CT abdomen/ pelvis. 06/04/2015 abdominal radiographs.  FINDINGS: Lower chest: No significant pulmonary nodules or acute consolidative airspace  disease.  Hepatobiliary: Normal liver with no liver mass. Normal gallbladder with no radiopaque cholelithiasis. No biliary ductal dilatation.  Pancreas: Normal, with no mass or duct dilation.  Spleen: Normal size. No mass.  Adrenals/Urinary Tract: Normal adrenals. There a few hypodense subcentimeter renal cortical lesions in both kidneys, too small to characterize. No hydronephrosis. Normal bladder.  Stomach/Bowel: Grossly normal stomach. There is severe wall thickening and hyperenhancement with prominent pericolonic fat stranding involving the mid to distal sigmoid colon. There is underlying mild sigmoid diverticulosis. There are 2 small bowel loops in the anterior pelvis in close proximity to the inflamed sigmoid colon, both of which demonstrate prominent small bowel wall thickening and hyperenhancement and prominent surrounding fat stranding and ill-defined fluid. There is a suggestion of a hyperenhancing curvilinear tract with internal gas between the sigmoid colon and 1 of the inflamed pelvic small bowel loops, which could represent a colo-enteric fistula. There is a tiny focus of extraluminal gas in the anterior pelvis. No drainable fluid collection is seen. The appendix is normal. There is no significant small bowel dilatation.  Vascular/Lymphatic: Normal caliber abdominal aorta. Patent portal, splenic, hepatic and renal veins. No pathologically enlarged lymph nodes in the abdomen or pelvis.  Reproductive: Normal size prostate and seminal  vesicles.  Other: Trace free fluid in the prerectal pelvic space.  Musculoskeletal: No aggressive appearing focal osseous lesions. Mild degenerative changes in the lower thoracic spine.  IMPRESSION: 1. Prominent wall thickening, wall hyperenhancement and pericolonic fat stranding in the mid to distal sigmoid colon, in association with underlying mild sigmoid diverticulosis. Two adjacent pelvic small bowel loops demonstrate severe  wall thickening and surrounding inflammatory fat stranding and fluid. 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 Tiny focus of pneumoperitoneum, with no focal drainable fluid collection. Differential includes Crohn disease versus perforated sigmoid diverticulitis with fistula formation. An underlying neoplasm is less likely but not entirely excluded. Surgical consultation is advised. 2. Normal appendix. Critical Value/emergent results were called by telephone at the time of interpretation on 08/03/2015 at 12:24 pm to Dr. Sharyn Creamer , who verbally acknowledged these results.     ----------------------------------------- 12:31 PM on 08/03/2015 -----------------------------------------  Dr. Tonita Cong reviewed clinical history, CT scan ____________________________________________   PROCEDURES  Procedure(s) performed: None  Critical Care performed: No  ____________________________________________   INITIAL IMPRESSION / ASSESSMENT AND PLAN / ED COURSE  Pertinent labs & imaging results that were available during my care of the patient were reviewed by me and considered in my medical decision making (see chart for details).  Patient presents with sudden onset severe abdominal pain. Significant focal tenderness in the right lower abdomen. Associated rebound and guarding in the right lower quadrant. Highly concerning and suspicious for intra-abdominal infection, with differential diagnosis including appendicitis, perforation, nephrolithiasis, less likely cholecystitis, diverticulitis. In the setting of recent cocaine abuse abdominal aortic dissection is also considered, though felt to be fairly unlikely. He has normal distal pulses. We will obtain CT imaging to further evaluate.   CT concerning for true abdominal infection, possibly Crohn's. There is microperforation. Reviewed and discussed CT scan with Dr. Lovett Sox in the ER.  He advises no surgical management, he'll perform consultation at the request of the hospitalist service. Patient reports pain improve, hemogram and amylase stable. Admit for GI consultation and antibiotics.   ____________________________________________   FINAL CLINICAL IMPRESSION(S) / ED DIAGNOSES  Final diagnoses:  Lower abdominal pain      Sharyn Creamer, MD 08/03/15 1610

## 2015-08-03 NOTE — Progress Notes (Signed)
Dr. Clint Guy notified of pt having a sharp pain that is waking him up out of his sleep after receiving the morphine and he states "that morphine isn't helping".  Asked MD for a different pain regimen. MD will place orders.

## 2015-08-03 NOTE — Progress Notes (Signed)
Received call from pharmacy that they don't carry nicotrol inhalers anymore and wanted to see if pt would like to use the gum or the patch. Pt declined both options and said he was fine .

## 2015-08-03 NOTE — ED Notes (Signed)
Lab running add on lab work.

## 2015-08-04 DIAGNOSIS — K5720 Diverticulitis of large intestine with perforation and abscess without bleeding: Secondary | ICD-10-CM

## 2015-08-04 LAB — BASIC METABOLIC PANEL
ANION GAP: 4 — AB (ref 5–15)
BUN: 13 mg/dL (ref 6–20)
CALCIUM: 8.3 mg/dL — AB (ref 8.9–10.3)
CO2: 25 mmol/L (ref 22–32)
Chloride: 108 mmol/L (ref 101–111)
Creatinine, Ser: 1.31 mg/dL — ABNORMAL HIGH (ref 0.61–1.24)
GFR calc Af Amer: >60 mL/min (ref >60)
GLUCOSE: 96 mg/dL (ref 65–99)
Potassium: 3.6 mmol/L (ref 3.5–5.1)
SODIUM: 137 mmol/L (ref 135–145)

## 2015-08-04 LAB — CBC
HCT: 40.2 % (ref 40.0–52.0)
Hemoglobin: 13.2 g/dL (ref 13.0–18.0)
MCH: 28.6 pg (ref 26.0–34.0)
MCHC: 32.7 g/dL (ref 32.0–36.0)
MCV: 87.3 fL (ref 80.0–100.0)
PLATELETS: 180 10*3/uL (ref 150–440)
RBC: 4.61 MIL/uL (ref 4.40–5.90)
RDW: 14.4 % (ref 11.5–14.5)
WBC: 14 10*3/uL — ABNORMAL HIGH (ref 3.8–10.6)

## 2015-08-04 LAB — C-REACTIVE PROTEIN: CRP: 13.4 mg/dL — AB (ref <1.0)

## 2015-08-04 MED ORDER — HYDROMORPHONE HCL 1 MG/ML IJ SOLN
1.0000 mg | INTRAMUSCULAR | Status: DC | PRN
  Administered 2015-08-04 – 2015-08-07 (×16): 1 mg via INTRAVENOUS
  Filled 2015-08-04 (×17): qty 1

## 2015-08-04 MED ORDER — SALINE SPRAY 0.65 % NA SOLN
1.0000 | NASAL | Status: DC | PRN
  Administered 2015-08-04 – 2015-08-05 (×2): 1 via NASAL
  Filled 2015-08-04: qty 44

## 2015-08-04 MED ORDER — SIMETHICONE 80 MG PO CHEW
80.0000 mg | CHEWABLE_TABLET | Freq: Four times a day (QID) | ORAL | Status: DC | PRN
  Administered 2015-08-04 – 2015-08-06 (×3): 80 mg via ORAL
  Filled 2015-08-04 (×3): qty 1

## 2015-08-04 MED ORDER — HYDROMORPHONE HCL 1 MG/ML IJ SOLN
0.5000 mg | INTRAMUSCULAR | Status: DC | PRN
  Administered 2015-08-04: 0.5 mg via INTRAVENOUS
  Filled 2015-08-04: qty 1

## 2015-08-04 NOTE — Progress Notes (Signed)
Pt continues to state that his pain is not being helped by the morphine and is requesting to get switched to the dilaudid since it has worked better for his pain. Dr. Sheryle Hail notified of his request and will place orders.

## 2015-08-04 NOTE — Consult Note (Signed)
[facility]  [address], [address] [place], [place] [address] Phone: [phone] Fax : [phone]  Consultation  Referring Provider:     No ref. provider found Primary Care Physician:  No PCP Per Patient Primary Gastroenterologist:  none         Reason for Consultation:     Abnormal CT scan with abdominal pain  Date of Admission:  08/03/2015 Date of Consultation:  08/04/2015         HPI:   Adam Pugh is a 34 y.o. male who was admitted with abdominal pain. The patient reports his abdominal pain is in his right side of his abdomen and he reports that he has been having abdominal pain since the morning prior to admission. The patient had a similar episode in the past but was found to have constipation in the ER and he was sent home. Now he reports that this has been much worse pain so he came back to the emergency room. The CT scan was suggestive of possible sigmoid diverticulitis with possible perforation and fistula formation. It also reported that it may represent Crohn's disease. The patient denies any history of this in the past. He also reports some pain on the left side but not as bad as on the right side.  History reviewed. No pertinent past medical history.  History reviewed. No pertinent past surgical history.  Prior to Admission medications   Medication Sig Start Date End Date Taking? Authorizing Provider  bisacodyl (DULCOLAX) 5 MG EC tablet Take 1 tablet (5 mg total) by mouth daily as needed for moderate constipation. Patient not taking: Reported on 08/03/2015 06/04/15 06/03/16  Delorise Royals Cuthriell, PA-C    Family History  Problem Relation Age of Onset  . Glaucoma Father   . Diabetes Other      Social History  Substance Use Topics  . Smoking status: Current Every Day Smoker -- 0.25 packs/day    Types: Cigarettes  . Smokeless tobacco: Never Used  . Alcohol Use: Yes     Comment: occ    Allergies as of 08/03/2015  . (No Known Allergies)     Review of Systems:    All systems reviewed and negative except where noted in HPI.   Physical Exam:  Vital signs in last 24 hours: Temp:  [99.7 F (37.6 C)-100.1 F (37.8 C)] 100.1 F (37.8 C) (12/05 1249) Pulse Rate:  [87-95] 91 (12/05 1249) Resp:  [18-20] 18 (12/05 1249) BP: (132-145)/(74-87) 145/87 mmHg (12/05 1249) SpO2:  [94 %-100 %] 99 % (12/05 1249) Last BM Date: 08/02/15 General:   Pleasant, cooperative in NAD Head:  Normocephalic and atraumatic. Eyes:   No icterus.   Conjunctiva pink. PERRLA. Ears:  Normal auditory acuity. Neck:  Supple; no masses or thyroidomegaly Lungs: Respirations even and unlabored. Lungs clear to auscultation bilaterally.   No wheezes, crackles, or rhonchi.  Heart:  Regular rate and rhythm;  Without murmur, clicks, rubs or gallops Abdomen:  Soft, nondistended, positive tenderness on the right with positive rebound and tenderness to palpation on the left. Normal bowel sounds. No appreciable masses or hepatomegaly.  No rebound or guarding.  Rectal:  Not performed. Msk:  Symmetrical without gross deformities.   Extremities:  Without edema, cyanosis or clubbing. Neurologic:  Alert and oriented x3;  grossly normal neurologically. Skin:  Intact without significant lesions or rashes. Cervical Nodes:  No significant cervical adenopathy. Psych:  Alert and cooperative. Normal affect.  LAB RESULTS:  Recent Labs  08/03/15 1019  08/04/15 0623  WBC 18.2* 14.0*  HGB 14.7 13.2  HCT 44.8 40.2  PLT 217 180   BMET  Recent Labs  08/03/15 1019 08/04/15 0623  NA 137 137  K 4.0 3.6  CL 105 108  CO2 23 25  GLUCOSE 159* 96  BUN 23* 13  CREATININE 1.35* 1.31*  CALCIUM 9.0 8.3*   LFT  Recent Labs  08/03/15 1019  PROT 7.4  ALBUMIN 4.2  AST 16  ALT 19  ALKPHOS 48  BILITOT 0.5   PT/INR No results for input(s): LABPROT, INR in the last 72 hours.  STUDIES: Ct Abdomen Pelvis W Contrast  08/03/2015  CLINICAL DATA:  Mid to lower right  abdominal pain. EXAM: CT ABDOMEN AND PELVIS WITH CONTRAST TECHNIQUE: Multidetector CT imaging of the abdomen and pelvis was performed using the standard protocol following bolus administration of intravenous contrast. CONTRAST:  OMNIPAQUE IOHEXOL 300 MG/ML  SOLN COMPARISON:  No prior CT abdomen/ pelvis. 06/04/2015 abdominal radiographs. FINDINGS: Lower chest: No significant pulmonary nodules or acute consolidative airspace disease. Hepatobiliary: Normal liver with no liver mass. Normal gallbladder with no radiopaque cholelithiasis. No biliary ductal dilatation. Pancreas: Normal, with no mass or duct dilation. Spleen: Normal size. No mass. Adrenals/Urinary Tract: Normal adrenals. There a few hypodense subcentimeter renal cortical lesions in both kidneys, too small to characterize. No hydronephrosis. Normal bladder. Stomach/Bowel: Grossly normal stomach. There is severe wall thickening and hyperenhancement with prominent pericolonic fat stranding involving the mid to distal sigmoid colon. There is underlying mild sigmoid diverticulosis. There are 2 small bowel loops in the anterior pelvis in close proximity to the inflamed sigmoid colon, both of which demonstrate prominent small bowel wall thickening and hyperenhancement and prominent surrounding fat stranding and ill-defined fluid. There is a suggestion of a hyperenhancing curvilinear tract with internal gas between the sigmoid colon and 1 of the inflamed pelvic small bowel loops, which could represent a colo-enteric fistula. There is a tiny focus of extraluminal gas in the anterior pelvis. No drainable fluid collection is seen. The appendix is normal. There is no significant small bowel dilatation. Vascular/Lymphatic: Normal caliber abdominal aorta. Patent portal, splenic, hepatic and renal veins. No pathologically enlarged lymph nodes in the abdomen or pelvis. Reproductive: Normal size prostate and seminal vesicles. Other: Trace free fluid in the prerectal  pelvic space. Musculoskeletal: No aggressive appearing focal osseous lesions. Mild degenerative changes in the lower thoracic spine. IMPRESSION: 1. Prominent wall thickening, wall hyperenhancement and pericolonic fat stranding in the mid to distal sigmoid colon, in association with underlying mild sigmoid diverticulosis. Two adjacent pelvic small bowel loops demonstrate severe wall thickening and surrounding inflammatory fat stranding and fluid. 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 Tiny focus of pneumoperitoneum, with no focal drainable fluid collection. Differential includes Crohn disease versus perforated sigmoid diverticulitis with fistula formation. An underlying neoplasm is less likely but not entirely excluded. Surgical consultation is advised. 2. Normal appendix. Critical Value/emergent results were called by telephone at the time of interpretation on 08/03/2015 at 12:24 pm to Dr. Sharyn Creamer , who verbally acknowledged these results. Electronically Signed   By: Delbert Phenix M.D.   On: 08/03/2015 12:26      Impression / Plan:   Adam Pugh is a 34 y.o. y/o male with abdominal pain which is more severe on the right with a CT scan showing sigmoid diverticulitis versus perforation versus Crohn's disease with possible fistula formation. The patient is  being followed by surgery. The patient is not a candidate for any endoscopic procedures at the present time. The patient has early had a IBD panel and CRP sent off. The CRP may be elevated due to the patient's acute infection. The patient will need a colonoscopy in the future to look at this area to ascertain whether the patient does or does not have Crohn's disease. The patient has been explained the plan and agrees with it.   Thank you for involving me in the care of this patient.      LOS: 1 day   Darlina Rumpf, MD  08/04/2015, 4:48 PM   Note: This dictation was prepared with  Dragon dictation along with smaller phrase technology. Any transcriptional errors that result from this process are unintentional.

## 2015-08-04 NOTE — Progress Notes (Signed)
CC: Lower abdominal pain Subjective: She describes ongoing but slightly improved lower abdominal pain he also has pain when he urinates as his bladder empties. That pain is in the suprapubic area and both lower quadrants. Denies fevers or chills and no nausea or vomiting. His chart is thoroughly reviewed.  Objective: Vital signs in last 24 hours: Temp:  [98.7 F (37.1 C)-99.7 F (37.6 C)] 99.7 F (37.6 C) (12/05 0519) Pulse Rate:  [84-101] 87 (12/05 0519) Resp:  [15-22] 20 (12/05 0519) BP: (98-132)/(64-89) 132/74 mmHg (12/05 0519) SpO2:  [94 %-100 %] 94 % (12/05 0519) Weight:  [230 lb (104.327 kg)] 230 lb (104.327 kg) (12/04 1014) Last BM Date: 08/02/15  Intake/Output from previous day: 12/04 0701 - 12/05 0700 In: 1750 [I.V.:1712; IV Piggyback:38] Out: 1900 [Urine:1900] Intake/Output this shift:    Physical exam:  Patient appears comfortable healthy male.  Abdomen is soft nondistended minimally tender in the lower quadrants with no peritoneal signs. Calves are nontender.  Lab Results: CBC   Recent Labs  08/03/15 1019 08/04/15 0623  WBC 18.2* 14.0*  HGB 14.7 13.2  HCT 44.8 40.2  PLT 217 180   BMET  Recent Labs  08/03/15 1019 08/04/15 0623  NA 137 137  K 4.0 3.6  CL 105 108  CO2 23 25  GLUCOSE 159* 96  BUN 23* 13  CREATININE 1.35* 1.31*  CALCIUM 9.0 8.3*   PT/INR No results for input(s): LABPROT, INR in the last 72 hours. ABG No results for input(s): PHART, HCO3 in the last 72 hours.  Invalid input(s): PCO2, PO2  Studies/Results: Ct Abdomen Pelvis W Contrast  08/03/2015  CLINICAL DATA:  Mid to lower right abdominal pain. EXAM: CT ABDOMEN AND PELVIS WITH CONTRAST TECHNIQUE: Multidetector CT imaging of the abdomen and pelvis was performed using the standard protocol following bolus administration of intravenous contrast. CONTRAST:  OMNIPAQUE IOHEXOL 300 MG/ML  SOLN COMPARISON:  No prior CT abdomen/ pelvis. 06/04/2015 abdominal radiographs. FINDINGS:  Lower chest: No significant pulmonary nodules or acute consolidative airspace disease. Hepatobiliary: Normal liver with no liver mass. Normal gallbladder with no radiopaque cholelithiasis. No biliary ductal dilatation. Pancreas: Normal, with no mass or duct dilation. Spleen: Normal size. No mass. Adrenals/Urinary Tract: Normal adrenals. There a few hypodense subcentimeter renal cortical lesions in both kidneys, too small to characterize. No hydronephrosis. Normal bladder. Stomach/Bowel: Grossly normal stomach. There is severe wall thickening and hyperenhancement with prominent pericolonic fat stranding involving the mid to distal sigmoid colon. There is underlying mild sigmoid diverticulosis. There are 2 small bowel loops in the anterior pelvis in close proximity to the inflamed sigmoid colon, both of which demonstrate prominent small bowel wall thickening and hyperenhancement and prominent surrounding fat stranding and ill-defined fluid. There is a suggestion of a hyperenhancing curvilinear tract with internal gas between the sigmoid colon and 1 of the inflamed pelvic small bowel loops, which could represent a colo-enteric fistula. There is a tiny focus of extraluminal gas in the anterior pelvis. No drainable fluid collection is seen. The appendix is normal. There is no significant small bowel dilatation. Vascular/Lymphatic: Normal caliber abdominal aorta. Patent portal, splenic, hepatic and renal veins. No pathologically enlarged lymph nodes in the abdomen or pelvis. Reproductive: Normal size prostate and seminal vesicles. Other: Trace free fluid in the prerectal pelvic space. Musculoskeletal: No aggressive appearing focal osseous lesions. Mild degenerative changes in the lower thoracic spine. IMPRESSION: 1. Prominent wall thickening, wall hyperenhancement and pericolonic fat stranding in the mid to distal sigmoid colon, in  association with underlying mild sigmoid diverticulosis. Two adjacent pelvic small bowel  loops demonstrate severe wall thickening and surrounding inflammatory fat stranding and fluid. 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 Tiny focus of pneumoperitoneum, with no focal drainable fluid collection. Differential includes Crohn disease versus perforated sigmoid diverticulitis with fistula formation. An underlying neoplasm is less likely but not entirely excluded. Surgical consultation is advised. 2. Normal appendix. Critical Value/emergent results were called by telephone at the time of interpretation on 08/03/2015 at 12:24 pm to Dr. Sharyn Creamer , who verbally acknowledged these results. Electronically Signed   By: Delbert Phenix M.D.   On: 08/03/2015 12:26    Anti-infectives: Anti-infectives    Start     Dose/Rate Route Frequency Ordered Stop   08/03/15 2130  piperacillin-tazobactam (ZOSYN) IVPB 3.375 g     3.375 g 12.5 mL/hr over 240 Minutes Intravenous 3 times per day 08/03/15 1411     08/03/15 1230  piperacillin-tazobactam (ZOSYN) IVPB 3.375 g     3.375 g 12.5 mL/hr over 240 Minutes Intravenous  Once 08/03/15 1227 08/03/15 1721      Assessment/Plan:  Suspecting Crohn's disease although diverticulitis is also a possibility. The treatment would require IV antibiotics for now and a GI workup as well. Really there are no surgical plans.  Lattie Haw, MD, FACS  08/04/2015

## 2015-08-04 NOTE — Plan of Care (Signed)
Problem: Nutrition: Goal: Adequate nutrition will be maintained Outcome: Not Progressing Pt is NPO   

## 2015-08-04 NOTE — Progress Notes (Signed)
[facility] - [place] at [facility]   PATIENT NAME: Adam Pugh    MR#:  [id number]  DATE OF BIRTH:  03/03/1981  SUBJECTIVE:  CHIEF COMPLAINT:   Chief Complaint  Patient presents with  . Abdominal Pain   -Admitted with significant abdominal pain and also complains of dysuria and suprapubic pain when urinating. His pain is much better than admission now. But still has significant tenderness in right lower quadrant today. Denies any nausea or diarrhea. Remains nothing by mouth.  REVIEW OF SYSTEMS:  Review of Systems  Constitutional: Negative for fever and chills.  HENT: Negative for ear discharge and nosebleeds.   Respiratory: Negative for cough, shortness of breath and wheezing.   Cardiovascular: Negative for chest pain, palpitations, claudication and leg swelling.  Gastrointestinal: Positive for abdominal pain. Negative for nausea, vomiting, diarrhea and constipation.  Genitourinary: Positive for dysuria. Negative for urgency, frequency and hematuria.  Musculoskeletal: Negative for myalgias.  Neurological: Negative for dizziness, tingling, tremors, focal weakness, seizures, weakness and headaches.  Endo/Heme/Allergies: Does not bruise/bleed easily.  Psychiatric/Behavioral: Negative for depression.    DRUG ALLERGIES:  No Known Allergies  VITALS:  Blood pressure 132/74, pulse 87, temperature 99.7 F (37.6 C), temperature source Oral, resp. rate 20, height  (1.88 m), weight 104.327 kg (230 lb), SpO2 94 %.  PHYSICAL EXAMINATION:  Physical Exam  GENERAL:  34 y.o.-year-old patient lying in the bed with no acute distress.  EYES: Pupils equal, round, reactive to light and accommodation. No scleral icterus. Extraocular muscles intact.  HEENT: Head atraumatic, normocephalic. Oropharynx and nasopharynx clear. His lips appear swollen which according to him  is normal. NECK:  Supple, no jugular venous distention. No thyroid enlargement, no tenderness.   LUNGS: Normal breath sounds bilaterally, no wheezing, rales,rhonchi or crepitation. No use of accessory muscles of respiration.  CARDIOVASCULAR: S1, S2 normal. No murmurs, rubs, or gallops.  ABDOMEN: Abdomen is firm to palpation, tenderness with voluntary guarding noted in right lower quadrant on exam. Nondistended abdomen. No hepatosplenomegaly. Hypoactive Bowel sounds present. No organomegaly or mass.  EXTREMITIES: No pedal edema, cyanosis, or clubbing.  NEUROLOGIC: Cranial nerves II through XII are intact. Muscle strength 5/5 in all extremities. Sensation intact. Gait not checked.  PSYCHIATRIC: The patient is alert and oriented x 3.  SKIN: No obvious rash, lesion, or ulcer.    LABORATORY PANEL:   CBC  Recent Labs Lab 08/04/15 0623  WBC 14.0*  HGB 13.2  HCT 40.2  PLT 180   ------------------------------------------------------------------------------------------------------------------  Chemistries   Recent Labs Lab 08/03/15 1019 08/04/15 0623  NA 137 137  K 4.0 3.6  CL 105 108  CO2 23 25  GLUCOSE 159* 96  BUN 23* 13  CREATININE 1.35* 1.31*  CALCIUM 9.0 8.3*  AST 16  --   ALT 19  --   ALKPHOS 48  --   BILITOT 0.5  --    ------------------------------------------------------------------------------------------------------------------  Cardiac Enzymes  Recent Labs Lab 08/03/15 1019  TROPONINI <0.03   ------------------------------------------------------------------------------------------------------------------  RADIOLOGY:  Ct Abdomen Pelvis W Contrast  08/03/2015  CLINICAL DATA:  Mid to lower right abdominal pain. EXAM: CT ABDOMEN AND PELVIS WITH CONTRAST TECHNIQUE: Multidetector CT imaging of the abdomen and pelvis was performed using the standard protocol following bolus administration of intravenous contrast. CONTRAST:  OMNIPAQUE IOHEXOL 300 MG/ML  SOLN COMPARISON:  No prior CT abdomen/ pelvis. 06/04/2015 abdominal radiographs. FINDINGS: Lower  chest: No significant pulmonary nodules or acute consolidative airspace disease. Hepatobiliary: Normal liver with no  liver mass. Normal gallbladder with no radiopaque cholelithiasis. No biliary ductal dilatation. Pancreas: Normal, with no mass or duct dilation. Spleen: Normal size. No mass. Adrenals/Urinary Tract: Normal adrenals. There a few hypodense subcentimeter renal cortical lesions in both kidneys, too small to characterize. No hydronephrosis. Normal bladder. Stomach/Bowel: Grossly normal stomach. There is severe wall thickening and hyperenhancement with prominent pericolonic fat stranding involving the mid to distal sigmoid colon. There is underlying mild sigmoid diverticulosis. There are 2 small bowel loops in the anterior pelvis in close proximity to the inflamed sigmoid colon, both of which demonstrate prominent small bowel wall thickening and hyperenhancement and prominent surrounding fat stranding and ill-defined fluid. There is a suggestion of a hyperenhancing curvilinear tract with internal gas between the sigmoid colon and 1 of the inflamed pelvic small bowel loops, which could represent a colo-enteric fistula. There is a tiny focus of extraluminal gas in the anterior pelvis. No drainable fluid collection is seen. The appendix is normal. There is no significant small bowel dilatation. Vascular/Lymphatic: Normal caliber abdominal aorta. Patent portal, splenic, hepatic and renal veins. No pathologically enlarged lymph nodes in the abdomen or pelvis. Reproductive: Normal size prostate and seminal vesicles. Other: Trace free fluid in the prerectal pelvic space. Musculoskeletal: No aggressive appearing focal osseous lesions. Mild degenerative changes in the lower thoracic spine. IMPRESSION: 1. Prominent wall thickening, wall hyperenhancement and pericolonic fat stranding in the mid to distal sigmoid colon, in association with underlying mild sigmoid diverticulosis. Two adjacent pelvic small bowel loops  demonstrate severe wall thickening and surrounding inflammatory fat stranding and fluid. 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 Tiny focus of pneumoperitoneum, with no focal drainable fluid collection. Differential includes Crohn disease versus perforated sigmoid diverticulitis with fistula formation. An underlying neoplasm is less likely but not entirely excluded. Surgical consultation is advised. 2. Normal appendix. Critical Value/emergent results were called by telephone at the time of interpretation on 08/03/2015 at 12:24 pm to Dr. Sharyn Creamer , who verbally acknowledged these results. Electronically Signed   By: Delbert Phenix M.D.   On: 08/03/2015 12:26    EKG:   Orders placed or performed during the hospital encounter of 08/03/15  . ED EKG  . ED EKG  . EKG 12-Lead  . EKG 12-Lead    ASSESSMENT AND PLAN:   33 year old male with no significant past medical history who presented to the hospital with abdominal pain and nausea and noted to have CT scan findings consistent with acute diverticulitis/microperforation with fistula formation.  #1 Acute abdominal pain-right lower quadrant tenderness. CT abdomen with possible diverticulitis versus Crohn's disease. -Appreciate surgical consult. -Continue nothing by mouth. Awaiting GI consult. Continue IV Zosyn. -Supportive care with IV fluids, pain control, antiemetics.  #2 Bowel Microperforation with fistula formation -possible Crohn's disease per surgical team. -Await GI input. -Patient is hemodynamically stable. Continue IV fluids and antibiotics  #3 polysubstance abuse-with cocaine and more marijuana. Monitor for any withdrawals.  #4 dysuria- urine analysis without any infection. Monitor. On IV antibiotics.  All the records are reviewed and case discussed with Care Management/Social Workerr. Management plans discussed with the patient, family and they are in agreement.  CODE  STATUS: Full code  TOTAL TIME TAKING CARE OF THIS PATIENT: 38 minutes.   POSSIBLE D/C IN 2 DAYS, DEPENDING ON CLINICAL CONDITION.   Enid Baas M.D on 08/04/2015 at 8:58 AM  Between 7am to 6pm - Pager - [phone]  After 6pm go to [email or web address] -  password [email or web address]  [facility]  Office  [phone]  CC: Primary care physician; No PCP Per Patient

## 2015-08-05 DIAGNOSIS — K5792 Diverticulitis of intestine, part unspecified, without perforation or abscess without bleeding: Secondary | ICD-10-CM

## 2015-08-05 LAB — BASIC METABOLIC PANEL
Anion gap: 6 (ref 5–15)
BUN: 9 mg/dL (ref 6–20)
CALCIUM: 8.1 mg/dL — AB (ref 8.9–10.3)
CO2: 24 mmol/L (ref 22–32)
CREATININE: 1.18 mg/dL (ref 0.61–1.24)
Chloride: 102 mmol/L (ref 101–111)
GFR calc non Af Amer: >60 mL/min (ref >60)
GLUCOSE: 88 mg/dL (ref 65–99)
Potassium: 3.1 mmol/L — ABNORMAL LOW (ref 3.5–5.1)
Sodium: 132 mmol/L — ABNORMAL LOW (ref 135–145)

## 2015-08-05 LAB — CBC
HEMATOCRIT: 37.1 % — AB (ref 40.0–52.0)
Hemoglobin: 12.5 g/dL — ABNORMAL LOW (ref 13.0–18.0)
MCH: 29.3 pg (ref 26.0–34.0)
MCHC: 33.6 g/dL (ref 32.0–36.0)
MCV: 87.3 fL (ref 80.0–100.0)
Platelets: 162 10*3/uL (ref 150–440)
RBC: 4.25 MIL/uL — ABNORMAL LOW (ref 4.40–5.90)
RDW: 14 % (ref 11.5–14.5)
WBC: 13 10*3/uL — ABNORMAL HIGH (ref 3.8–10.6)

## 2015-08-05 MED ORDER — POTASSIUM CHLORIDE 10 MEQ/100ML IV SOLN
10.0000 meq | INTRAVENOUS | Status: AC
  Administered 2015-08-05 (×3): 10 meq via INTRAVENOUS
  Filled 2015-08-05 (×4): qty 100

## 2015-08-05 NOTE — Progress Notes (Signed)
[facility] - [place] at [facility]   PATIENT NAME: Adam Pugh    MR#:  [id number]  DATE OF BIRTH:  [birth year]  SUBJECTIVE:  CHIEF COMPLAINT:   Chief Complaint  Patient presents with  . Abdominal Pain   -Still has right lower quadrant abdominal pain, but much better than yesterday. Denies any nausea or vomiting. -Tolerating clear liquid diet well  REVIEW OF SYSTEMS:  Review of Systems  Constitutional: Negative for fever and chills.  HENT: Negative for ear discharge and nosebleeds.   Respiratory: Negative for cough, shortness of breath and wheezing.   Cardiovascular: Negative for chest pain, palpitations, claudication and leg swelling.  Gastrointestinal: Positive for abdominal pain. Negative for nausea, vomiting, diarrhea and constipation.  Genitourinary: Negative for dysuria, urgency, frequency and hematuria.  Musculoskeletal: Negative for myalgias.  Neurological: Negative for dizziness, tingling, tremors, focal weakness, seizures, weakness and headaches.  Endo/Heme/Allergies: Does not bruise/bleed easily.  Psychiatric/Behavioral: Negative for depression.    DRUG ALLERGIES:  No Known Allergies  VITALS:  Blood pressure 132/71, pulse 95, temperature 98.6 F (37 C), temperature source Oral, resp. rate 18, height 6\' 2"  (1.88 m), weight 104.327 kg (230 lb), SpO2 95 %.  PHYSICAL EXAMINATION:  Physical Exam  GENERAL:  34 y.o.-year-old patient lying in the bed with no acute distress.  EYES: Pupils equal, round, reactive to light and accommodation. No scleral icterus. Extraocular muscles intact.  HEENT: Head atraumatic, normocephalic. Oropharynx and nasopharynx clear. His lips appear swollen which according to him  is normal. NECK:  Supple, no jugular venous distention. No thyroid enlargement, no tenderness.  LUNGS: Normal breath sounds bilaterally, no wheezing, rales,rhonchi or crepitation. No use of accessory muscles of respiration.  CARDIOVASCULAR:  S1, S2 normal. No murmurs, rubs, or gallops.  ABDOMEN: Abdomen is soft to palpation, tenderness with voluntary guarding noted in right lower quadrant on exam. Nondistended abdomen. No hepatosplenomegaly. Hypoactive Bowel sounds present. No organomegaly or mass.  EXTREMITIES: No pedal edema, cyanosis, or clubbing.  NEUROLOGIC: Cranial nerves II through XII are intact. Muscle strength 5/5 in all extremities. Sensation intact. Gait not checked.  PSYCHIATRIC: The patient is alert and oriented x 3.  SKIN: No obvious rash, lesion, or ulcer.    LABORATORY PANEL:   CBC  Recent Labs Lab 08/05/15 0505  WBC 13.0*  HGB 12.5*  HCT 37.1*  PLT 162   ------------------------------------------------------------------------------------------------------------------  Chemistries   Recent Labs Lab 08/03/15 1019  08/05/15 0505  NA 137  < > 132*  K 4.0  < > 3.1*  CL 105  < > 102  CO2 23  < > 24  GLUCOSE 159*  < > 88  BUN 23*  < > 9  CREATININE 1.35*  < > 1.18  CALCIUM 9.0  < > 8.1*  AST 16  --   --   ALT 19  --   --   ALKPHOS 48  --   --   BILITOT 0.5  --   --   < > = values in this interval not displayed. ------------------------------------------------------------------------------------------------------------------  Cardiac Enzymes  Recent Labs Lab 08/03/15 1019  TROPONINI <0.03   ------------------------------------------------------------------------------------------------------------------  RADIOLOGY:  No results found.  EKG:   Orders placed or performed during the hospital encounter of 08/03/15  . ED EKG  . ED EKG  . EKG 12-Lead  . EKG 12-Lead    ASSESSMENT AND PLAN:   34 year old male with no significant past medical history who presented to the hospital with abdominal pain and nausea  and noted to have CT scan findings consistent with acute diverticulitis/microperforation with fistula formation.  #1 Acute abdominal pain-right lower quadrant tenderness. CT  abdomen with possible diverticulitis versus Crohn's disease. -Appreciate surgical and GI consult. -IV fluids, Continue IV Zosyn. -on clear liquid diet- monitor and advance to full tomorrow as tolerated - will discharge on oral antibiotics once improved - outpatient colonoscopy recommended  #2 Bowel Microperforation with fistula formation -possible Crohn's disease vs diverticulitis - treat with ABX and outpatient follow up with GI for colonoscopy  #3 polysubstance abuse-with cocaine and more marijuana. Monitor for any withdrawals.  #4 Hypokalemia- being replaced  All the records are reviewed and case discussed with Care Management/Social Workerr. Management plans discussed with the patient, family and they are in agreement.  CODE STATUS: Full code  TOTAL TIME TAKING CARE OF THIS PATIENT: 36 minutes.   POSSIBLE D/C IN 1-2 DAYS, DEPENDING ON CLINICAL CONDITION.   Enid Baas M.D on 08/05/2015 at 3:32 PM  Between 7am to 6pm - Pager - [phone]  After 6pm go to [email or web address] - password [email or web address] [facility]  [facility]  Office  [phone]  CC: Primary care physician; No PCP Per Patient

## 2015-08-05 NOTE — Progress Notes (Signed)
Subjective: The patient reports that his abdominal pain is much improved. He has no complaints today. The patient has been moving his bowels with no problem.   Objective: Vital signs in last 24 hours: Filed Vitals:   08/04/15 1249 08/04/15 2122 08/05/15 0511 08/05/15 1218  BP: 145/87 127/82 124/81 132/71  Pulse: 91 93 97 95  Temp: 100.1 F (37.8 C) 99.9 F (37.7 C) 99.8 F (37.7 C) 98.6 F (37 C)  TempSrc: Oral Oral Oral Oral  Resp: 18 16 18 18   Height:      Weight:      SpO2: 99% 98% 94% 95%   Weight change:   Intake/Output Summary (Last 24 hours) at 08/05/15 1401 Last data filed at 08/05/15 0830  Gross per 24 hour  Intake 2619.5 ml  Output   1600 ml  Net 1019.5 ml     Exam: Abdomen soft and mild tenderness and nondistended Alert and orientated 3   Lab Results: @LABTEST2 @ Micro Results: Recent Results (from the past 240 hour(s))  Culture, blood (routine x 2)     Status: None (Preliminary result)   Collection Time: 08/03/15  1:10 PM  Result Value Ref Range Status   Specimen Description BLOOD RIGHT ASSIST CONTROL  Final   Special Requests   Final    BOTTLES DRAWN AEROBIC AND ANAEROBIC  5CC AERO, 6CC ANAERO   Culture NO GROWTH 2 DAYS  Final   Report Status PENDING  Incomplete  Culture, blood (routine x 2)     Status: None (Preliminary result)   Collection Time: 08/03/15  1:11 PM  Result Value Ref Range Status   Specimen Description BLOOD LEFT ASSIST CONTROL  Final   Special Requests   Final    BOTTLES DRAWN AEROBIC AND ANAEROBIC  3CC AERO, 5CC ANAERO   Culture NO GROWTH 2 DAYS  Final   Report Status PENDING  Incomplete   Studies/Results: No results found. Medications: I have reviewed the patient's current medications. Scheduled Meds: . enoxaparin (LOVENOX) injection  40 mg Subcutaneous Q24H  . piperacillin-tazobactam (ZOSYN)  IV  3.375 g Intravenous 3 times per day   Continuous Infusions: . sodium chloride 125 mL/hr at 08/05/15 1344   PRN  Meds:.acetaminophen **OR** acetaminophen, HYDROmorphone (DILAUDID) injection, ondansetron **OR** ondansetron (ZOFRAN) IV, simethicone, sodium chloride   Assessment: Active Problems:   Diverticulitis   Lower abdominal pain    Plan: This patient came in with possible diverticulitis with a fistula versus Crohn's disease. The patient has had severe right-sided pain and states he is feeling better now that he is on antibiotic. The patient has been told that no procedure will be done until he has recovered from this episode. The patient has been given my card and will follow up as an outpatient.   LOS: 2 days   Daren Servando Snare 08/05/2015, 2:01 PM

## 2015-08-05 NOTE — Progress Notes (Signed)
CC: Lower abdominal pain Subjective: Patient feels better today than he did yesterday and it is hungry. Denies fevers or chills. Overall improved.  Objective: Vital signs in last 24 hours: Temp:  [98.6 F (37 C)-99.9 F (37.7 C)] 98.6 F (37 C) (12/06 1218) Pulse Rate:  [93-97] 95 (12/06 1218) Resp:  [16-18] 18 (12/06 1218) BP: (124-132)/(71-82) 132/71 mmHg (12/06 1218) SpO2:  [94 %-98 %] 95 % (12/06 1218) Last BM Date: 08/02/15  Intake/Output from previous day: 12/05 0701 - 12/06 0700 In: 2619.5 [P.O.:118; I.V.:2361.5; IV Piggyback:140] Out: 1875 [Urine:1875] Intake/Output this shift:    Physical exam:  Soft abdomen minimally tender in the right lower quadrant and supra pubic area without obvious peritoneal signs.  Lab Results: CBC   Recent Labs  08/04/15 0623 08/05/15 0505  WBC 14.0* 13.0*  HGB 13.2 12.5*  HCT 40.2 37.1*  PLT 180 162   BMET  Recent Labs  08/04/15 0623 08/05/15 0505  NA 137 132*  K 3.6 3.1*  CL 108 102  CO2 25 24  GLUCOSE 96 88  BUN 13 9  CREATININE 1.31* 1.18  CALCIUM 8.3* 8.1*   PT/INR No results for input(s): LABPROT, INR in the last 72 hours. ABG No results for input(s): PHART, HCO3 in the last 72 hours.  Invalid input(s): PCO2, PO2  Studies/Results: No results found.  Anti-infectives: Anti-infectives    Start     Dose/Rate Route Frequency Ordered Stop   08/03/15 2130  piperacillin-tazobactam (ZOSYN) IVPB 3.375 g     3.375 g 12.5 mL/hr over 240 Minutes Intravenous 3 times per day 08/03/15 1411     08/03/15 1230  piperacillin-tazobactam (ZOSYN) IVPB 3.375 g     3.375 g 12.5 mL/hr over 240 Minutes Intravenous  Once 08/03/15 1227 08/03/15 1721      Assessment/Plan:  Patient doing very well suspect Crohn's versus diverticulitis plan is to continue IV antibiotics and discharged to follow-up with Dr. wall for colonoscopy at some point. No surgical plans at this time  Lattie Haw, MD, FACS  08/05/2015

## 2015-08-06 ENCOUNTER — Encounter: Payer: Self-pay | Admitting: Radiology

## 2015-08-06 ENCOUNTER — Inpatient Hospital Stay: Payer: Medicaid Other

## 2015-08-06 LAB — BASIC METABOLIC PANEL
Anion gap: 8 (ref 5–15)
BUN: 7 mg/dL (ref 6–20)
CALCIUM: 8.6 mg/dL — AB (ref 8.9–10.3)
CO2: 23 mmol/L (ref 22–32)
CREATININE: 1.1 mg/dL (ref 0.61–1.24)
Chloride: 106 mmol/L (ref 101–111)
GFR calc Af Amer: >60 mL/min (ref >60)
GFR calc non Af Amer: >60 mL/min (ref >60)
GLUCOSE: 92 mg/dL (ref 65–99)
Potassium: 3.1 mmol/L — ABNORMAL LOW (ref 3.5–5.1)
Sodium: 137 mmol/L (ref 135–145)

## 2015-08-06 LAB — INFLAMMATORY BOWEL DISEASE-IBD
Saccharomyces cerevisiae, IgA: <20 Units (ref 0.0–24.9)
Saccharomyces cerevisiae, IgG: <20 Units (ref 0.0–24.9)

## 2015-08-06 MED ORDER — OXYCODONE-ACETAMINOPHEN 5-325 MG PO TABS
1.0000 | ORAL_TABLET | ORAL | Status: DC | PRN
  Administered 2015-08-06 – 2015-08-08 (×6): 1 via ORAL
  Filled 2015-08-06 (×7): qty 1

## 2015-08-06 MED ORDER — POTASSIUM CHLORIDE CRYS ER 20 MEQ PO TBCR
40.0000 meq | EXTENDED_RELEASE_TABLET | Freq: Once | ORAL | Status: AC
  Administered 2015-08-06: 40 meq via ORAL
  Filled 2015-08-06: qty 2

## 2015-08-06 MED ORDER — IOHEXOL 300 MG/ML  SOLN
100.0000 mL | Freq: Once | INTRAMUSCULAR | Status: AC | PRN
Start: 2015-08-06 — End: 2015-08-06
  Administered 2015-08-06: 100 mL via INTRAVENOUS

## 2015-08-06 MED ORDER — IOHEXOL 240 MG/ML SOLN
25.0000 mL | INTRAMUSCULAR | Status: AC
  Administered 2015-08-06 (×2): 25 mL via ORAL

## 2015-08-06 MED ORDER — DOCUSATE SODIUM 100 MG PO CAPS
100.0000 mg | ORAL_CAPSULE | Freq: Two times a day (BID) | ORAL | Status: DC
  Administered 2015-08-06 – 2015-08-08 (×4): 100 mg via ORAL
  Filled 2015-08-06 (×3): qty 1

## 2015-08-06 NOTE — Progress Notes (Signed)
CC: Right lower quadrant pain Subjective: This patient with a tentative diagnosis of either Crohn's or diverticulitis with a normal appendix on CT scan who is improving on IV antibiotics he has less pain and no nausea or vomiting. He is scheduled for a CT scan this morning.  Objective: Vital signs in last 24 hours: Temp:  [98.6 F (37 C)-100.5 F (38.1 C)] 99.7 F (37.6 C) (12/07 0508) Pulse Rate:  [95-101] 101 (12/07 0508) Resp:  [17-18] 18 (12/07 0508) BP: (132-153)/(71-89) 153/89 mmHg (12/07 0508) SpO2:  [95 %-98 %] 98 % (12/07 0508) Last BM Date: 08/02/15  Intake/Output from previous day: 12/06 0701 - 12/07 0700 In: 1960 [P.O.:1960] Out: 2900 [Urine:2900] Intake/Output this shift:    Physical exam:  Comfortable awake alert and oriented.  Abdomen is soft nondistended and only minimally tender much improved over prior exams. Nontender calves.  Lab Results: CBC   Recent Labs  08/04/15 0623 08/05/15 0505  WBC 14.0* 13.0*  HGB 13.2 12.5*  HCT 40.2 37.1*  PLT 180 162   BMET  Recent Labs  08/05/15 0505 08/06/15 0448  NA 132* 137  K 3.1* 3.1*  CL 102 106  CO2 24 23  GLUCOSE 88 92  BUN 9 7  CREATININE 1.18 1.10  CALCIUM 8.1* 8.6*   PT/INR No results for input(s): LABPROT, INR in the last 72 hours. ABG No results for input(s): PHART, HCO3 in the last 72 hours.  Invalid input(s): PCO2, PO2  Studies/Results: No results found.  Anti-infectives: Anti-infectives    Start     Dose/Rate Route Frequency Ordered Stop   08/03/15 2130  piperacillin-tazobactam (ZOSYN) IVPB 3.375 g     3.375 g 12.5 mL/hr over 240 Minutes Intravenous 3 times per day 08/03/15 1411     08/03/15 1230  piperacillin-tazobactam (ZOSYN) IVPB 3.375 g     3.375 g 12.5 mL/hr over 240 Minutes Intravenous  Once 08/03/15 1227 08/03/15 1721      Assessment/Plan:  CT scan ordered by primary team for this morning I will review those results and discuss plans with the patient I would  continue IV antibiotics for another 2 days prior to discharge. This of course pending the results of the CT  Lattie Haw, MD, FACS  08/06/2015

## 2015-08-06 NOTE — Progress Notes (Signed)
ANTIBIOTIC CONSULT NOTE - FOLLOW UP  Pharmacy Consult for Zosyn Indication: Diverticulitis  No Known Allergies  Patient Measurements: Height: 6\' 2"  (188 cm) Weight: 230 lb (104.327 kg) IBW/kg (Calculated) : 82.2 Adjusted Body Weight: na  Vital Signs: Temp: 99.7 F (37.6 C) (12/07 0508) Temp Source: Oral (12/07 0508) BP: 153/89 mmHg (12/07 0508) Pulse Rate: 101 (12/07 0508) Intake/Output from previous day: 12/06 0701 - 12/07 0700 In: 1960 [P.O.:1960] Out: 2900 [Urine:2900] Intake/Output from this shift:    Labs:  Recent Labs  08/04/15 0623 08/05/15 0505 08/06/15 0448  WBC 14.0* 13.0*  --   HGB 13.2 12.5*  --   PLT 180 162  --   CREATININE 1.31* 1.18 1.10   Estimated Creatinine Clearance: 124.1 mL/min (by C-G formula based on Cr of 1.1). No results for input(s): VANCOTROUGH, VANCOPEAK, VANCORANDOM, GENTTROUGH, GENTPEAK, GENTRANDOM, TOBRATROUGH, TOBRAPEAK, TOBRARND, AMIKACINPEAK, AMIKACINTROU, AMIKACIN in the last 72 hours.   Microbiology: Recent Results (from the past 720 hour(s))  Culture, blood (routine x 2)     Status: None (Preliminary result)   Collection Time: 08/03/15  1:10 PM  Result Value Ref Range Status   Specimen Description BLOOD RIGHT ASSIST CONTROL  Final   Special Requests   Final    BOTTLES DRAWN AEROBIC AND ANAEROBIC  5CC AERO, 6CC ANAERO   Culture NO GROWTH 3 DAYS  Final   Report Status PENDING  Incomplete  Culture, blood (routine x 2)     Status: None (Preliminary result)   Collection Time: 08/03/15  1:11 PM  Result Value Ref Range Status   Specimen Description BLOOD LEFT ASSIST CONTROL  Final   Special Requests   Final    BOTTLES DRAWN AEROBIC AND ANAEROBIC  3CC AERO, 5CC ANAERO   Culture NO GROWTH 3 DAYS  Final   Report Status PENDING  Incomplete    Anti-infectives    Start     Dose/Rate Route Frequency Ordered Stop   08/03/15 2130  piperacillin-tazobactam (ZOSYN) IVPB 3.375 g     3.375 g 12.5 mL/hr over 240 Minutes Intravenous 3  times per day 08/03/15 1411     08/03/15 1230  piperacillin-tazobactam (ZOSYN) IVPB 3.375 g     3.375 g 12.5 mL/hr over 240 Minutes Intravenous  Once 08/03/15 1227 08/03/15 1721      Assessment: Patient is currently on day 4 of Zosyn 3.375g IV q8h for diverticulitis vs crohn's. Surgery recommends continuing for at least 2 more days depending on CT results.  12/6 WBC: 13.0 Tmax: 100.5  Goal of Therapy:  Resolution of symptoms  Plan:  Will continue with current therapy and monitor progress.  Clovia Cuff, PharmD, BCPS 08/06/2015 11:47 AM

## 2015-08-06 NOTE — Progress Notes (Signed)
[facility] - [place] at [facility]   PATIENT NAME: Adam Pugh    MR#:  [id number]  DATE OF BIRTH:  02/15/1981  SUBJECTIVE:  CHIEF COMPLAINT:   Chief Complaint  Patient presents with  . Abdominal Pain   - Till has some right lower quadrant abdominal pain, much better than yesterday. Able to ambulate in the hallways. -Tolerating clear liquids without any concerns. Had a low-grade temperature last night.  REVIEW OF SYSTEMS:  Review of Systems  Constitutional: Positive for fever. Negative for chills.  HENT: Negative for ear discharge and nosebleeds.   Respiratory: Negative for cough, shortness of breath and wheezing.   Cardiovascular: Negative for chest pain, palpitations, claudication and leg swelling.  Gastrointestinal: Positive for abdominal pain. Negative for nausea, vomiting, diarrhea and constipation.  Genitourinary: Negative for dysuria, urgency, frequency and hematuria.  Musculoskeletal: Negative for myalgias.  Neurological: Negative for dizziness, tingling, tremors, focal weakness, seizures, weakness and headaches.  Endo/Heme/Allergies: Does not bruise/bleed easily.  Psychiatric/Behavioral: Negative for depression.    DRUG ALLERGIES:  No Known Allergies  VITALS:  Blood pressure 153/89, pulse 101, temperature 99.7 F (37.6 C), temperature source Oral, resp. rate 18, height 6\' 2"  (1.88 m), weight 104.327 kg (230 lb), SpO2 98 %.  PHYSICAL EXAMINATION:  Physical Exam  GENERAL:  34 y.o.-year-old patient lying in the bed with no acute distress.  EYES: Pupils equal, round, reactive to light and accommodation. No scleral icterus. Extraocular muscles intact.  HEENT: Head atraumatic, normocephalic. Oropharynx and nasopharynx clear. His lips appear swollen which according to him  is normal. NECK:  Supple, no jugular venous distention. No thyroid enlargement, no tenderness.  LUNGS: Normal breath sounds bilaterally, no wheezing, rales,rhonchi or  crepitation. No use of accessory muscles of respiration.  CARDIOVASCULAR: S1, S2 normal. No murmurs, rubs, or gallops.  ABDOMEN: Abdomen is soft to palpation, tenderness without guarding noted in right lower quadrant on exam. Nondistended abdomen. No hepatosplenomegaly. Normal Bowel sounds present. No organomegaly or mass.  EXTREMITIES: No pedal edema, cyanosis, or clubbing.  NEUROLOGIC: Cranial nerves II through XII are intact. Muscle strength 5/5 in all extremities. Sensation intact. Gait not checked.  PSYCHIATRIC: The patient is alert and oriented x 3.  SKIN: No obvious rash, lesion, or ulcer.    LABORATORY PANEL:   CBC  Recent Labs Lab 08/05/15 0505  WBC 13.0*  HGB 12.5*  HCT 37.1*  PLT 162   ------------------------------------------------------------------------------------------------------------------  Chemistries   Recent Labs Lab 08/03/15 1019  08/06/15 0448  NA 137  < > 137  K 4.0  < > 3.1*  CL 105  < > 106  CO2 23  < > 23  GLUCOSE 159*  < > 92  BUN 23*  < > 7  CREATININE 1.35*  < > 1.10  CALCIUM 9.0  < > 8.6*  AST 16  --   --   ALT 19  --   --   ALKPHOS 48  --   --   BILITOT 0.5  --   --   < > = values in this interval not displayed. ------------------------------------------------------------------------------------------------------------------  Cardiac Enzymes  Recent Labs Lab 08/03/15 1019  TROPONINI <0.03   ------------------------------------------------------------------------------------------------------------------  RADIOLOGY:  Ct Abdomen Pelvis W Contrast  08/06/2015  CLINICAL DATA:  History of diverticulitis. EXAM: CT ABDOMEN AND PELVIS WITH CONTRAST TECHNIQUE: Multidetector CT imaging of the abdomen and pelvis was performed using the standard protocol following bolus administration of intravenous contrast. CONTRAST:  <MEASUREM[facility]<MEASUREMENNyu Ho<MEASUREMENMoberl<MEASUREMENO[facility]<MEASUREM<MEASUREMEN[facility]0 MG/ML  SOLN COMPARISON:  CT scan of August 03, 2015. FINDINGS: Visualized lung bases are  unremarkable. No significant osseous abnormality is noted. No gallstones are noted. The liver, spleen and pancreas appear normal. Adrenal glands and kidneys appear normal. No hydronephrosis or renal obstruction is noted. The appendix appears normal. There is no evidence of bowel obstruction. There remains wall thickening and surrounding inflammation involving sigmoid colon consistent with diverticulitis. There is also noted significant wall thickening and inflammation involving an adjacent small bowel loop which is unchanged compared to prior exam. There is noted several gas bubbles between the inflamed colon and small bowel consistent with perforation and possible fistula formation. Urinary bladder appears normal. Mildly increased amount of free fluid is noted in the dependent portion of the pelvis. Stable mildly enlarged bilateral inguinal adenopathy is noted which most likely is reactive in etiology. IMPRESSION: There is continued presence of wall thickening and surrounding inflammation involving sigmoid colon consistent with acute diverticulitis which is not significantly changed compared to prior exam. There is also stable wall thickening and surrounding inflammation of adjacent small bowel loop, with small amount of free air noted between these 2 bowel loops suggesting localized perforation or fistula formation. These findings also are not significantly changed compared to prior exam. There is noted mildly increased amount of free fluid seen in the posterior cul-de-sac of the pelvis which most likely is inflammatory in etiology. Electronically Signed   By: Lupita Raider, M.D.   On: 08/06/2015 10:49    EKG:   Orders placed or performed during the hospital encounter of 08/03/15  . ED EKG  . ED EKG  . EKG 12-Lead  . EKG 12-Lead    ASSESSMENT AND PLAN:   34 year old male with no significant past medical history who presented to the hospital with abdominal pain and nausea and noted to have CT scan  findings consistent with acute diverticulitis/microperforation with fistula formation.  #1 Acute abdominal pain-right lower quadrant tenderness. CT abdomen with possible diverticulitis versus Crohn's disease. -Appreciate surgical and GI consult. -IV fluids, Continue IV Zosyn. -With low-grade temperature, though clinically improving-we will repeat CT abdomen with oral contrast today. -on clear liquid diet- monitor and advance to full later today - will discharge on oral antibiotics once improved - outpatient colonoscopy recommended  #2 Bowel Microperforation with fistula formation -possible Crohn's disease vs diverticulitis - treat with ABX and outpatient follow up with GI for colonoscopy Probably also had prostatitis on presentation. Improving with antibiotics. No suprapubic pain/pressure. No dysuria.  #3 polysubstance abuse-with cocaine and more marijuana. Stable without any withdrawals  #4 Hypokalemia- being replaced  All the records are reviewed and case discussed with Care Management/Social Workerr. Management plans discussed with the patient, family and they are in agreement.  CODE STATUS: Full code  TOTAL TIME TAKING CARE OF THIS PATIENT: 36 minutes.   POSSIBLE D/C IN 1-2 DAYS, DEPENDING ON CLINICAL CONDITION.   Enid Baas M.D on 08/06/2015 at 12:42 PM  Between 7am to 6pm - Pager - [phone]  After 6pm go to [email or web address] - password [email or web address] [facility]  [facility]  Office  [phone]  CC: Primary care physician; No PCP Per Patient

## 2015-08-07 LAB — CBC
HEMATOCRIT: 35.1 % — AB (ref 40.0–52.0)
Hemoglobin: 11.8 g/dL — ABNORMAL LOW (ref 13.0–18.0)
MCH: 29.2 pg (ref 26.0–34.0)
MCHC: 33.7 g/dL (ref 32.0–36.0)
MCV: 86.8 fL (ref 80.0–100.0)
PLATELETS: 188 10*3/uL (ref 150–440)
RBC: 4.05 MIL/uL — ABNORMAL LOW (ref 4.40–5.90)
RDW: 13.8 % (ref 11.5–14.5)
WBC: 8.4 10*3/uL (ref 3.8–10.6)

## 2015-08-07 LAB — BASIC METABOLIC PANEL
ANION GAP: 7 (ref 5–15)
BUN: 7 mg/dL (ref 6–20)
CALCIUM: 8.7 mg/dL — AB (ref 8.9–10.3)
CO2: 25 mmol/L (ref 22–32)
CREATININE: 0.97 mg/dL (ref 0.61–1.24)
Chloride: 107 mmol/L (ref 101–111)
GLUCOSE: 80 mg/dL (ref 65–99)
Potassium: 3.7 mmol/L (ref 3.5–5.1)
Sodium: 139 mmol/L (ref 135–145)

## 2015-08-07 MED ORDER — METRONIDAZOLE 500 MG PO TABS
500.0000 mg | ORAL_TABLET | Freq: Three times a day (TID) | ORAL | Status: DC
Start: 2015-08-07 — End: 2015-08-08
  Administered 2015-08-07 – 2015-08-08 (×3): 500 mg via ORAL
  Filled 2015-08-07 (×3): qty 1

## 2015-08-07 MED ORDER — CIPROFLOXACIN HCL 500 MG PO TABS
500.0000 mg | ORAL_TABLET | Freq: Two times a day (BID) | ORAL | Status: DC
  Administered 2015-08-07 – 2015-08-08 (×3): 500 mg via ORAL
  Filled 2015-08-07 (×3): qty 1

## 2015-08-07 NOTE — Progress Notes (Signed)
CC: Right lower quadrant pain Subjective: Patient being treated for Crohn's versus diverticulitis he is improving on IV antibiotics. He states that his abdominal pain is improved and thinks he is going home tomorrow he is tolerating a diet. No fevers or chills.  Objective: Vital signs in last 24 hours: Temp:  [97.9 F (36.6 C)-98.1 F (36.7 C)] 98.1 F (36.7 C) (12/08 0510) Pulse Rate:  [68-79] 68 (12/08 0510) Resp:  [18-20] 18 (12/08 0510) BP: (130-140)/(78-79) 130/78 mmHg (12/08 0510) SpO2:  [97 %-100 %] 100 % (12/08 0510) Last BM Date: 08/07/15  Intake/Output from previous day: 12/07 0701 - 12/08 0700 In: 5396.7 [P.O.:480; I.V.:4671.7; IV Piggyback:245] Out: 3050 [Urine:3050] Intake/Output this shift: Total I/O In: 714 [P.O.:600; I.V.:97; IV Piggyback:17] Out: 350 [Urine:350]  Physical exam:  Soft nontender abdomen with the exception of some mild right lower quadrant tenderness no peritoneal signs nontender calves awake alert and oriented  Lab Results: CBC   Recent Labs  08/05/15 0505 08/07/15 0428  WBC 13.0* 8.4  HGB 12.5* 11.8*  HCT 37.1* 35.1*  PLT 162 188   BMET  Recent Labs  08/06/15 0448 08/07/15 0428  NA 137 139  K 3.1* 3.7  CL 106 107  CO2 23 25  GLUCOSE 92 80  BUN 7 7  CREATININE 1.10 0.97  CALCIUM 8.6* 8.7*   PT/INR No results for input(s): LABPROT, INR in the last 72 hours. ABG No results for input(s): PHART, HCO3 in the last 72 hours.  Invalid input(s): PCO2, PO2  Studies/Results: Ct Abdomen Pelvis W Contrast  08/06/2015  CLINICAL DATA:  History of diverticulitis. EXAM: CT ABDOMEN AND PELVIS WITH CONTRAST TECHNIQUE: Multidetector CT imaging of the abdomen and pelvis was performed using the standard protocol following bolus administration of intravenous contrast. CONTRAST:  OMNIPAQUE IOHEXOL 300 MG/ML  SOLN COMPARISON:  CT scan of August 03, 2015. FINDINGS: Visualized lung bases are unremarkable. No significant osseous abnormality  is noted. No gallstones are noted. The liver, spleen and pancreas appear normal. Adrenal glands and kidneys appear normal. No hydronephrosis or renal obstruction is noted. The appendix appears normal. There is no evidence of bowel obstruction. There remains wall thickening and surrounding inflammation involving sigmoid colon consistent with diverticulitis. There is also noted significant wall thickening and inflammation involving an adjacent small bowel loop which is unchanged compared to prior exam. There is noted several gas bubbles between the inflamed colon and small bowel consistent with perforation and possible fistula formation. Urinary bladder appears normal. Mildly increased amount of free fluid is noted in the dependent portion of the pelvis. Stable mildly enlarged bilateral inguinal adenopathy is noted which most likely is reactive in etiology. IMPRESSION: There is continued presence of wall thickening and surrounding inflammation involving sigmoid colon consistent with acute diverticulitis which is not significantly changed compared to prior exam. There is also stable wall thickening and surrounding inflammation of adjacent small bowel loop, with small amount of free air noted between these 2 bowel loops suggesting localized perforation or fistula formation. These findings also are not significantly changed compared to prior exam. There is noted mildly increased amount of free fluid seen in the posterior cul-de-sac of the pelvis which most likely is inflammatory in etiology. Electronically Signed   By: Lupita Raider, M.D.   On: 08/06/2015 10:49    Anti-infectives: Anti-infectives    Start     Dose/Rate Route Frequency Ordered Stop   08/07/15 1400  metroNIDAZOLE (FLAGYL) tablet 500 mg     500  mg Oral 3 times per day 08/07/15 1038     08/07/15 1045  ciprofloxacin (CIPRO) tablet 500 mg     500 mg Oral 2 times daily 08/07/15 1038     08/03/15 2130  piperacillin-tazobactam (ZOSYN) IVPB 3.375 g   Status:  Discontinued     3.375 g 12.5 mL/hr over 240 Minutes Intravenous 3 times per day 08/03/15 1411 08/07/15 1038   08/03/15 1230  piperacillin-tazobactam (ZOSYN) IVPB 3.375 g     3.375 g 12.5 mL/hr over 240 Minutes Intravenous  Once 08/03/15 1227 08/03/15 1721      Assessment/Plan:  Slow improvement on IV antibiotics agree the patient will likely be able to go home tomorrow on oral antibiotics to follow up with GI for colonoscopy and general surgery as needed.  Lattie Haw, MD, FACS  08/07/2015

## 2015-08-07 NOTE — Progress Notes (Signed)
Patient went outside to smoking again after having a long discussion with the Nursing Supervisor, Larita Fife about not going off the floor and going to other departments, and smoking; Patients asking about pain med: "I really don't need it....are you going to bring it...."  Discussed with patient the need to not take medicine unless he really needed it, not just to be taking it. Windy Carina, RN; 08/07/2015; 11:29 PM

## 2015-08-07 NOTE — Progress Notes (Signed)
[facility] - [place] at [facility]   PATIENT NAME: Adam Pugh    MR#:  [id number]  DATE OF BIRTH:  [birth year]  SUBJECTIVE:  CHIEF COMPLAINT:   Chief Complaint  Patient presents with  . Abdominal Pain   - Much improving abdominal pain, still not completeley back to normal - will try to change to oral antibiotics today  REVIEW OF SYSTEMS:  Review of Systems  Constitutional: Negative for fever and chills.  HENT: Negative for ear discharge and nosebleeds.   Respiratory: Negative for cough, shortness of breath and wheezing.   Cardiovascular: Negative for chest pain, palpitations, claudication and leg swelling.  Gastrointestinal: Positive for abdominal pain. Negative for nausea, vomiting, diarrhea and constipation.  Genitourinary: Negative for dysuria, urgency, frequency and hematuria.  Musculoskeletal: Negative for myalgias.  Neurological: Negative for dizziness, tingling, tremors, focal weakness, seizures, weakness and headaches.  Endo/Heme/Allergies: Does not bruise/bleed easily.  Psychiatric/Behavioral: Negative for depression.    DRUG ALLERGIES:  No Known Allergies  VITALS:  Blood pressure 130/78, pulse 68, temperature 98.1 F (36.7 C), temperature source Oral, resp. rate 18, height 6\' 2"  (1.88 m), weight 104.327 kg (230 lb), SpO2 100 %.  PHYSICAL EXAMINATION:  Physical Exam  GENERAL:  34 y.o.-year-old patient lying in the bed with no acute distress.  EYES: Pupils equal, round, reactive to light and accommodation. No scleral icterus. Extraocular muscles intact.  HEENT: Head atraumatic, normocephalic. Oropharynx and nasopharynx clear. His lips appear swollen which according to him  is normal. NECK:  Supple, no jugular venous distention. No thyroid enlargement, no tenderness.  LUNGS: Normal breath sounds bilaterally, no wheezing, rales,rhonchi or crepitation. No use of accessory muscles of respiration.  CARDIOVASCULAR: S1, S2 normal. No  murmurs, rubs, or gallops.  ABDOMEN: Abdomen is soft to palpation, tenderness without guarding noted in right lower quadrant on exam. Nondistended abdomen. No hepatosplenomegaly. Normal Bowel sounds present. No organomegaly or mass.  EXTREMITIES: No pedal edema, cyanosis, or clubbing.  NEUROLOGIC: Cranial nerves II through XII are intact. Muscle strength 5/5 in all extremities. Sensation intact. Gait not checked.  PSYCHIATRIC: The patient is alert and oriented x 3.  SKIN: No obvious rash, lesion, or ulcer.    LABORATORY PANEL:   CBC  Recent Labs Lab 08/07/15 0428  WBC 8.4  HGB 11.8*  HCT 35.1*  PLT 188   ------------------------------------------------------------------------------------------------------------------  Chemistries   Recent Labs Lab 08/03/15 1019  08/07/15 0428  NA 137  < > 139  K 4.0  < > 3.7  CL 105  < > 107  CO2 23  < > 25  GLUCOSE 159*  < > 80  BUN 23*  < > 7  CREATININE 1.35*  < > 0.97  CALCIUM 9.0  < > 8.7*  AST 16  --   --   ALT 19  --   --   ALKPHOS 48  --   --   BILITOT 0.5  --   --   < > = values in this interval not displayed. ------------------------------------------------------------------------------------------------------------------  Cardiac Enzymes  Recent Labs Lab 08/03/15 1019  TROPONINI <0.03   ------------------------------------------------------------------------------------------------------------------  RADIOLOGY:  Ct Abdomen Pelvis W Contrast  08/06/2015  CLINICAL DATA:  History of diverticulitis. EXAM: CT ABDOMEN AND PELVIS WITH CONTRAST TECHNIQUE: Multidetector CT imaging of the abdomen and pelvis was performed using the standard protocol following bolus administration of intravenous contrast. CONTRAST:  OMNIPAQUE IOHEXOL 300 MG/ML  SOLN COMPARISON:  CT scan of August 03, 2015. FINDINGS: Visualized lung  bases are unremarkable. No significant osseous abnormality is noted. No gallstones are noted. The liver,  spleen and pancreas appear normal. Adrenal glands and kidneys appear normal. No hydronephrosis or renal obstruction is noted. The appendix appears normal. There is no evidence of bowel obstruction. There remains wall thickening and surrounding inflammation involving sigmoid colon consistent with diverticulitis. There is also noted significant wall thickening and inflammation involving an adjacent small bowel loop which is unchanged compared to prior exam. There is noted several gas bubbles between the inflamed colon and small bowel consistent with perforation and possible fistula formation. Urinary bladder appears normal. Mildly increased amount of free fluid is noted in the dependent portion of the pelvis. Stable mildly enlarged bilateral inguinal adenopathy is noted which most likely is reactive in etiology. IMPRESSION: There is continued presence of wall thickening and surrounding inflammation involving sigmoid colon consistent with acute diverticulitis which is not significantly changed compared to prior exam. There is also stable wall thickening and surrounding inflammation of adjacent small bowel loop, with small amount of free air noted between these 2 bowel loops suggesting localized perforation or fistula formation. These findings also are not significantly changed compared to prior exam. There is noted mildly increased amount of free fluid seen in the posterior cul-de-sac of the pelvis which most likely is inflammatory in etiology. Electronically Signed   By: Lupita Raider, M.D.   On: 08/06/2015 10:49    EKG:   Orders placed or performed during the hospital encounter of 08/03/15  . ED EKG  . ED EKG  . EKG 12-Lead  . EKG 12-Lead    ASSESSMENT AND PLAN:   34 year old male with no significant past medical history who presented to the hospital with abdominal pain and nausea and noted to have CT scan findings consistent with acute diverticulitis/microperforation with fistula formation.  #1  Acute abdominal pain-right lower quadrant tenderness. CT abdomen with possible diverticulitis and likely  Crohn's disease. -Appreciate surgical and GI consult. -IV fluids, Continue IV Zosyn- change to oral cipro and flagyl -no further fevers, repeat CT with no worsening, clinically improving -tolerating liquids- advance diet - outpatient colonoscopy recommended  #2 Bowel Microperforation with fistula formation -possible Crohn's disease vs diverticulitis - treat with ABX and outpatient follow up with GI for colonoscopy Probably also had prostatitis on presentation. Improving with antibiotics. No suprapubic pain/pressure. No dysuria.  #3 polysubstance abuse-with cocaine and more marijuana. Stable without any withdrawals  #4 Hypokalemia- replaced  All the records are reviewed and case discussed with Care Management/Social Workerr. Management plans discussed with the patient, family and they are in agreement.  CODE STATUS: Full code  TOTAL TIME TAKING CARE OF THIS PATIENT: 36 minutes.   POSSIBLE D/C TOMORROW, DEPENDING ON CLINICAL CONDITION.   Enid Baas M.D on 08/07/2015 at 10:29 AM  Between 7am to 6pm - Pager - [phone]  After 6pm go to [email or web address] - password [email or web address] [facility]  [place] [facility]  Office  [phone]  CC: Primary care physician; No PCP Per Patient

## 2015-08-07 NOTE — Progress Notes (Signed)
Dr. Anne Hahn notified of patient's disappearance and smoking; Does not want to discharge him at this time; Windy Carina, RN; 08/07/2015; 8:43 PM

## 2015-08-07 NOTE — Progress Notes (Signed)
Patient walked off floor prior to this RN getting report tonight; had discussed with pt. last night, that he was not to go off the floor due to pain meds, IVF, etc...; smelled smoke on pt. when he came back last night; Patient not to be found tonight; Supervisor and security notified; unable to find patient for over an hour; Pt found in ED "visiting a friend"; Patient told to return to floor; still awaiting arrival at this time; Windy Carina, RN; 08/07/2015;

## 2015-08-08 LAB — CULTURE, BLOOD (ROUTINE X 2)
CULTURE: NO GROWTH
Culture: NO GROWTH

## 2015-08-08 MED ORDER — CIPROFLOXACIN HCL 500 MG PO TABS: 500.0000 mg | ORAL_TABLET | Freq: Two times a day (BID) | ORAL | Status: DC

## 2015-08-08 MED ORDER — METRONIDAZOLE 500 MG PO TABS: 500.0000 mg | ORAL_TABLET | Freq: Three times a day (TID) | ORAL | Status: DC

## 2015-08-08 MED ORDER — OXYCODONE-ACETAMINOPHEN 5-325 MG PO TABS: 1.0000 | ORAL_TABLET | Freq: Four times a day (QID) | ORAL | Status: DC | PRN

## 2015-08-08 NOTE — Progress Notes (Signed)
CC: Right lower quadrant pain Subjective: he is much improved less pain tolerating a regular diet. His chills. No nausea or vomiting no dysuria Objective: Vital signs in last 24 hours: Temp:  [97.7 F (36.5 C)-98.5 F (36.9 C)] 98.5 F (36.9 C) (12/09 0437) Pulse Rate:  [66-75] 70 (12/09 0437) Resp:  [18] 18 (12/09 0437) BP: (127-141)/(82-83) 141/83 mmHg (12/09 0437) SpO2:  [100 %] 100 % (12/09 0437) Last BM Date: 08/07/15  Intake/Output from previous day: 12/08 0701 - 12/09 0700 In: 1434 [P.O.:1320; I.V.:97; IV Piggyback:17] Out: 2375 [Urine:2375] Intake/Output this shift:    Physical exam:  Soft abdomen minimally tender in the right lower quadrant without guarding or rebound tenderness nontender calves awake alert and oriented  Lab Results: CBC   Recent Labs  08/07/15 0428  WBC 8.4  HGB 11.8*  HCT 35.1*  PLT 188   BMET  Recent Labs  08/06/15 0448 08/07/15 0428  NA 137 139  K 3.1* 3.7  CL 106 107  CO2 23 25  GLUCOSE 92 80  BUN 7 7  CREATININE 1.10 0.97  CALCIUM 8.6* 8.7*   PT/INR No results for input(s): LABPROT, INR in the last 72 hours. ABG No results for input(s): PHART, HCO3 in the last 72 hours.  Invalid input(s): PCO2, PO2  Studies/Results: Ct Abdomen Pelvis W Contrast  08/06/2015  CLINICAL DATA:  History of diverticulitis. EXAM: CT ABDOMEN AND PELVIS WITH CONTRAST TECHNIQUE: Multidetector CT imaging of the abdomen and pelvis was performed using the standard protocol following bolus administration of intravenous contrast. CONTRAST:  OMNIPAQUE IOHEXOL 300 MG/ML  SOLN COMPARISON:  CT scan of August 03, 2015. FINDINGS: Visualized lung bases are unremarkable. No significant osseous abnormality is noted. No gallstones are noted. The liver, spleen and pancreas appear normal. Adrenal glands and kidneys appear normal. No hydronephrosis or renal obstruction is noted. The appendix appears normal. There is no evidence of bowel obstruction. There  remains wall thickening and surrounding inflammation involving sigmoid colon consistent with diverticulitis. There is also noted significant wall thickening and inflammation involving an adjacent small bowel loop which is unchanged compared to prior exam. There is noted several gas bubbles between the inflamed colon and small bowel consistent with perforation and possible fistula formation. Urinary bladder appears normal. Mildly increased amount of free fluid is noted in the dependent portion of the pelvis. Stable mildly enlarged bilateral inguinal adenopathy is noted which most likely is reactive in etiology. IMPRESSION: There is continued presence of wall thickening and surrounding inflammation involving sigmoid colon consistent with acute diverticulitis which is not significantly changed compared to prior exam. There is also stable wall thickening and surrounding inflammation of adjacent small bowel loop, with small amount of free air noted between these 2 bowel loops suggesting localized perforation or fistula formation. These findings also are not significantly changed compared to prior exam. There is noted mildly increased amount of free fluid seen in the posterior cul-de-sac of the pelvis which most likely is inflammatory in etiology. Electronically Signed   By: Lupita Raider, M.D.   On: 08/06/2015 10:49    Anti-infectives: Anti-infectives    Start     Dose/Rate Route Frequency Ordered Stop   08/08/15 0000  ciprofloxacin (CIPRO) 500 MG tablet     500 mg Oral 2 times daily 08/08/15 0748     08/08/15 0000  metroNIDAZOLE (FLAGYL) 500 MG tablet     500 mg Oral Every 8 hours 08/08/15 0748     08/07/15 1400  metroNIDAZOLE (FLAGYL) tablet 500 mg     500 mg Oral 3 times per day 08/07/15 1038     08/07/15 1045  ciprofloxacin (CIPRO) tablet 500 mg     500 mg Oral 2 times daily 08/07/15 1038     08/03/15 2130  piperacillin-tazobactam (ZOSYN) IVPB 3.375 g  Status:  Discontinued     3.375 g 12.5 mL/hr  over 240 Minutes Intravenous 3 times per day 08/03/15 1411 08/07/15 1038   08/03/15 1230  piperacillin-tazobactam (ZOSYN) IVPB 3.375 g     3.375 g 12.5 mL/hr over 240 Minutes Intravenous  Once 08/03/15 1227 08/03/15 1721      Assessment/Plan:  Patient is being treated for either diverticulitis or Crohn's disease unclear of the etiology. He is to have an outpatient colonoscopy by Dr. Servando Snare. I believe he can be discharged today on oral antibiotics to follow-up with Dr. Daleen Squibb and with [facility] surgical in 10 days  Lattie Haw, MD, FACS  08/08/2015

## 2015-08-08 NOTE — Discharge Instructions (Signed)
Follow all MD discharge instructions. Take all medications as prescribed. Keep all follow up appointments. If your symptoms return, call your doctor. If you experience any new symptoms that are of concern to you or that are bothersome to you, call your doctor. For all questions and/or concerns, call your doctor. ° ° If you have a medical emergency, call 911 ° ° ° °

## 2015-08-08 NOTE — Care Management (Signed)
Spoke with patient for discharge planning. Patient is self payer with limited income. Patient given applications for [facility] , [facility] and [facility] provider list. Coupons given for discharge medication for at total cost to patient is @ $25. Patient stated that he can afford this.  Patient expressed appreciation for services. Discharge today.

## 2015-08-08 NOTE — Discharge Summary (Signed)
[facility] [place] [facility]   PATIENT NAME: Adam Pugh    MR#:  [id number]  DATE OF BIRTH:  09/12/1980  DATE OF ADMISSION:  08/03/2015 ADMITTING PHYSICIAN: Houston Siren, MD  DATE OF DISCHARGE: 08/08/15  PRIMARY CARE PHYSICIAN: No PCP Per Patient    ADMISSION DIAGNOSIS:  Lower abdominal pain [R10.30]  DISCHARGE DIAGNOSIS:  Active Problems:   Diverticulitis   Lower abdominal pain   SECONDARY DIAGNOSIS:  History reviewed. No pertinent past medical history.  HOSPITAL COURSE:   34 year old male with no significant past medical history who presented to the hospital with abdominal pain and nausea and noted to have CT scan findings consistent with acute diverticulitis/microperforation with fistula formation.  #1 Acute abdominal pain-right lower quadrant tenderness. Secondary to acute diverticulitis. - CT abdomen with possible diverticulitis and likely Crohn's disease. -Appreciate surgical and GI consult. -IV fluids, changed ABX to oral cipro and flagyl -no further fevers, repeat CT with no worsening, clinically improving -tolerating soft diet - outpatient colonoscopy recommended  #2 Bowel Microperforation with fistula formation -possible Crohn's disease vs diverticulitis - treat with ABX and outpatient follow up with GI for colonoscopy Also had prostatitis on presentation. Improving with antibiotics. No suprapubic pain/pressure. No dysuria.  #3 polysubstance abuse-with cocaine and more marijuana. Stable without any withdrawals Walking off the floor in hospital for smoking  #4 Hypokalemia- replaced  Patient being discharged today  DISCHARGE CONDITIONS:   Stable  CONSULTS OBTAINED:  Treatment Team:  Ricarda Frame, MD Midge Minium, MD  DRUG ALLERGIES:  No Known Allergies  DISCHARGE MEDICATIONS:   Current Discharge Medication List    START taking these medications   Details  ciprofloxacin (CIPRO) 500 MG tablet Take 1  tablet (500 mg total) by mouth 2 (two) times daily. Qty: 20 tablet, Refills: 0    metroNIDAZOLE (FLAGYL) 500 MG tablet Take 1 tablet (500 mg total) by mouth every 8 (eight) hours. Qty: 30 tablet, Refills: 0    oxyCODONE-acetaminophen (PERCOCET/ROXICET) 5-325 MG tablet Take 1 tablet by mouth every 6 (six) hours as needed for moderate pain. Qty: 20 tablet, Refills: 0      STOP taking these medications     bisacodyl (DULCOLAX) 5 MG EC tablet          DISCHARGE INSTRUCTIONS:   1. GI follow up in 2 weeks Will need outpatient colonoscopy  If you experience worsening of your admission symptoms, develop shortness of breath, life threatening emergency, suicidal or homicidal thoughts you must seek medical attention immediately by calling 911 or calling your MD immediately  if symptoms less severe.  You Must read complete instructions/literature along with all the possible adverse reactions/side effects for all the Medicines you take and that have been prescribed to you. Take any new Medicines after you have completely understood and accept all the possible adverse reactions/side effects.   Please note  You were cared for by a hospitalist during your hospital stay. If you have any questions about your discharge medications or the care you received while you were in the hospital after you are discharged, you can call the unit and asked to speak with the hospitalist on call if the hospitalist that took care of you is not available. Once you are discharged, your primary care physician will handle any further medical issues. Please note that NO REFILLS for any discharge medications will be authorized once you are discharged, as it is imperative that you return to your primary care physician (or establish  a relationship with a primary care physician if you do not have one) for your aftercare needs so that they can reassess your need for medications and monitor your lab values.    Today   CHIEF  COMPLAINT:   Chief Complaint  Patient presents with  . Abdominal Pain    VITAL SIGNS:  Blood pressure 141/83, pulse 70, temperature 98.5 F (36.9 C), temperature source Oral, resp. rate 18, height  (1.88 m), weight 104.327 kg (230 lb), SpO2 100 %.  I/O:   Intake/Output Summary (Last 24 hours) at 08/08/15 0748 Last data filed at 08/08/15 0437  Gross per 24 hour  Intake   1320 ml  Output   2375 ml  Net  -1055 ml    PHYSICAL EXAMINATION:   Physical Exam  GENERAL: 34 y.o.-year-old patient lying in the bed with no acute distress.  EYES: Pupils equal, round, reactive to light and accommodation. No scleral icterus. Extraocular muscles intact.  HEENT: Head atraumatic, normocephalic. Oropharynx and nasopharynx clear. His lips appear swollen which according to him is normal. NECK: Supple, no jugular venous distention. No thyroid enlargement, no tenderness.  LUNGS: Normal breath sounds bilaterally, no wheezing, rales,rhonchi or crepitation. No use of accessory muscles of respiration.  CARDIOVASCULAR: S1, S2 normal. No murmurs, rubs, or gallops.  ABDOMEN: Abdomen is soft to palpation, minimal tenderness without guarding noted in right lower quadrant on exam. Nondistended abdomen. No hepatosplenomegaly. Normal Bowel sounds present. No organomegaly or mass.  EXTREMITIES: No pedal edema, cyanosis, or clubbing.  NEUROLOGIC: Cranial nerves II through XII are intact. Muscle strength 5/5 in all extremities. Sensation intact. Gait not checked.  PSYCHIATRIC: The patient is alert and oriented x 3.  SKIN: No obvious rash, lesion, or ulcer.   DATA REVIEW:   CBC  Recent Labs Lab 08/07/15 0428  WBC 8.4  HGB 11.8*  HCT 35.1*  PLT 188    Chemistries   Recent Labs Lab 08/03/15 1019  08/07/15 0428  NA 137  < > 139  K 4.0  < > 3.7  CL 105  < > 107  CO2 23  < > 25  GLUCOSE 159*  < > 80  BUN 23*  < > 7  CREATININE 1.35*  < > 0.97  CALCIUM 9.0  < > 8.7*  AST 16  --   --    ALT 19  --   --   ALKPHOS 48  --   --   BILITOT 0.5  --   --   < > = values in this interval not displayed.  Cardiac Enzymes  Recent Labs Lab 08/03/15 1019  TROPONINI <0.03    Microbiology Results  Results for orders placed or performed during the hospital encounter of 08/03/15  Culture, blood (routine x 2)     Status: None   Collection Time: 08/03/15  1:10 PM  Result Value Ref Range Status   Specimen Description BLOOD RIGHT ASSIST CONTROL  Final   Special Requests   Final    BOTTLES DRAWN AEROBIC AND ANAEROBIC  5CC AERO, 6CC ANAERO   Culture NO GROWTH 5 DAYS  Final   Report Status 08/08/2015 FINAL  Final  Culture, blood (routine x 2)     Status: None   Collection Time: 08/03/15  1:11 PM  Result Value Ref Range Status   Specimen Description BLOOD LEFT ASSIST CONTROL  Final   Special Requests   Final    BOTTLES DRAWN AEROBIC AND ANAEROBIC  3CC AERO, 5CC ANAERO  Culture NO GROWTH 5 DAYS  Final   Report Status 08/08/2015 FINAL  Final    RADIOLOGY:  Ct Abdomen Pelvis W Contrast  08/06/2015  CLINICAL DATA:  History of diverticulitis. EXAM: CT ABDOMEN AND PELVIS WITH CONTRAST TECHNIQUE: Multidetector CT imaging of the abdomen and pelvis was performed using the standard protocol following bolus administration of intravenous contrast. CONTRAST:  OMNIPAQUE IOHEXOL 300 MG/ML  SOLN COMPARISON:  CT scan of August 03, 2015. FINDINGS: Visualized lung bases are unremarkable. No significant osseous abnormality is noted. No gallstones are noted. The liver, spleen and pancreas appear normal. Adrenal glands and kidneys appear normal. No hydronephrosis or renal obstruction is noted. The appendix appears normal. There is no evidence of bowel obstruction. There remains wall thickening and surrounding inflammation involving sigmoid colon consistent with diverticulitis. There is also noted significant wall thickening and inflammation involving an adjacent small bowel loop which is unchanged  compared to prior exam. There is noted several gas bubbles between the inflamed colon and small bowel consistent with perforation and possible fistula formation. Urinary bladder appears normal. Mildly increased amount of free fluid is noted in the dependent portion of the pelvis. Stable mildly enlarged bilateral inguinal adenopathy is noted which most likely is reactive in etiology. IMPRESSION: There is continued presence of wall thickening and surrounding inflammation involving sigmoid colon consistent with acute diverticulitis which is not significantly changed compared to prior exam. There is also stable wall thickening and surrounding inflammation of adjacent small bowel loop, with small amount of free air noted between these 2 bowel loops suggesting localized perforation or fistula formation. These findings also are not significantly changed compared to prior exam. There is noted mildly increased amount of free fluid seen in the posterior cul-de-sac of the pelvis which most likely is inflammatory in etiology. Electronically Signed   By: Lupita Raider, M.D.   On: 08/06/2015 10:49    EKG:   Orders placed or performed during the hospital encounter of 08/03/15  . ED EKG  . ED EKG  . EKG 12-Lead  . EKG 12-Lead      Management plans discussed with the patient, family and they are in agreement.  CODE STATUS:     Code Status Orders        Start     Ordered   08/03/15 1356  Full code   Continuous     08/03/15 1355      TOTAL TIME TAKING CARE OF THIS PATIENT: 37 minutes.    Enid Baas M.D on 08/08/2015 at 7:48 AM  Between 7am to 6pm - Pager - [phone]  After 6pm go to [email or web address] - password [email or web address] [facility]  [facility]  Office  [phone]  CC: Primary care physician; No PCP Per Patient

## 2015-08-08 NOTE — Progress Notes (Signed)
Pt d/c home; d/c instructions reviewed w/ pt; pt understanding was verbalized; IV removed catheter in tact, gauze dressing applied; all pt questions answered; pt left unit via wheelchair accompanied by staff 

## 2015-08-08 NOTE — Progress Notes (Signed)
Nutrition Assessment:    Consult received from RN regarding diet education for diverticulosis.    Per Dr. Earmon Phoenix note treating for diverticulitis vs crohn's disease unclear etiology.  Planning colonscopy to confirm diagnosis.    Will hold off on diet education at this time until diagnosis confirmed.  Discussed with RN, Zaneta.   Lyal Husted B. Freida Busman, RD, LDN [phone] (pager)

## 2015-08-12 ENCOUNTER — Telehealth: Payer: Self-pay

## 2015-08-12 NOTE — Telephone Encounter (Signed)
Post discharge call to patient made at this time. Patient is still having some pain but has been taking Percocet as prescribed. I explained to patient that the pain medication is not designed for pain to be completely gone but to make it tolerable. He states that he feels like it is tolerable at this time. He verbalizes understanding of this. No questions or concerns.   Post-hospitalization appointment for this patient has been scheduled. Encouraged patient to call with any questions that arise prior to appointment.

## 2015-08-15 ENCOUNTER — Inpatient Hospital Stay: Payer: Self-pay | Admitting: Surgery

## 2015-08-20 ENCOUNTER — Inpatient Hospital Stay: Payer: Self-pay | Admitting: General Surgery

## 2015-09-07 ENCOUNTER — Inpatient Hospital Stay
Admission: EM | Admit: 2015-09-07 | Discharge: 2015-09-10 | DRG: 872 | Disposition: A | Discharge status: Home / Self Care | Payer: Medicaid Other | Attending: Internal Medicine | Admitting: Internal Medicine

## 2015-09-07 ENCOUNTER — Encounter: Payer: Self-pay | Admitting: *Deleted

## 2015-09-07 DIAGNOSIS — K5090 Crohn's disease, unspecified, without complications: Secondary | ICD-10-CM | POA: Diagnosis present

## 2015-09-07 DIAGNOSIS — N289 Disorder of kidney and ureter, unspecified: Secondary | ICD-10-CM | POA: Diagnosis present

## 2015-09-07 DIAGNOSIS — K5720 Diverticulitis of large intestine with perforation and abscess without bleeding: Secondary | ICD-10-CM | POA: Diagnosis present

## 2015-09-07 DIAGNOSIS — R319 Hematuria, unspecified: Secondary | ICD-10-CM | POA: Diagnosis present

## 2015-09-07 DIAGNOSIS — E871 Hypo-osmolality and hyponatremia: Secondary | ICD-10-CM | POA: Diagnosis present

## 2015-09-07 DIAGNOSIS — F1490 Cocaine use, unspecified, uncomplicated: Secondary | ICD-10-CM | POA: Diagnosis present

## 2015-09-07 DIAGNOSIS — K529 Noninfective gastroenteritis and colitis, unspecified: Secondary | ICD-10-CM | POA: Diagnosis present

## 2015-09-07 DIAGNOSIS — Z8669 Personal history of other diseases of the nervous system and sense organs: Secondary | ICD-10-CM

## 2015-09-07 DIAGNOSIS — Z720 Tobacco use: Secondary | ICD-10-CM

## 2015-09-07 DIAGNOSIS — F17210 Nicotine dependence, cigarettes, uncomplicated: Secondary | ICD-10-CM | POA: Diagnosis present

## 2015-09-07 DIAGNOSIS — Z792 Long term (current) use of antibiotics: Secondary | ICD-10-CM

## 2015-09-07 DIAGNOSIS — K5780 Diverticulitis of intestine, part unspecified, with perforation and abscess without bleeding: Secondary | ICD-10-CM | POA: Diagnosis present

## 2015-09-07 DIAGNOSIS — K632 Fistula of intestine: Secondary | ICD-10-CM | POA: Diagnosis present

## 2015-09-07 DIAGNOSIS — A419 Sepsis, unspecified organism: Principal | ICD-10-CM | POA: Diagnosis present

## 2015-09-07 DIAGNOSIS — E860 Dehydration: Secondary | ICD-10-CM | POA: Diagnosis present

## 2015-09-07 DIAGNOSIS — N390 Urinary tract infection, site not specified: Secondary | ICD-10-CM | POA: Diagnosis present

## 2015-09-07 DIAGNOSIS — Z833 Family history of diabetes mellitus: Secondary | ICD-10-CM

## 2015-09-07 HISTORY — DX: Diverticulitis of large intestine with perforation and abscess without bleeding: K57.20

## 2015-09-07 LAB — COMPREHENSIVE METABOLIC PANEL
ALT: 14 U/L — AB (ref 17–63)
AST: 12 U/L — AB (ref 15–41)
Albumin: 3.9 g/dL (ref 3.5–5.0)
Alkaline Phosphatase: 46 U/L (ref 38–126)
Anion gap: 8 (ref 5–15)
BUN: 13 mg/dL (ref 6–20)
CHLORIDE: 95 mmol/L — AB (ref 101–111)
CO2: 27 mmol/L (ref 22–32)
CREATININE: 1.28 mg/dL — AB (ref 0.61–1.24)
Calcium: 8.8 mg/dL — ABNORMAL LOW (ref 8.9–10.3)
Glucose, Bld: 98 mg/dL (ref 65–99)
POTASSIUM: 3.5 mmol/L (ref 3.5–5.1)
SODIUM: 130 mmol/L — AB (ref 135–145)
Total Bilirubin: 1.1 mg/dL (ref 0.3–1.2)
Total Protein: 7.8 g/dL (ref 6.5–8.1)

## 2015-09-07 LAB — URINALYSIS COMPLETE WITH MICROSCOPIC (ARMC ONLY)
Bilirubin Urine: NEGATIVE
Glucose, UA: NEGATIVE mg/dL
Nitrite: NEGATIVE
PH: 6 (ref 5.0–8.0)
PROTEIN: NEGATIVE mg/dL
SQUAMOUS EPITHELIAL / LPF: NONE SEEN
Specific Gravity, Urine: 1.019 (ref 1.005–1.030)

## 2015-09-07 LAB — CBC
HEMATOCRIT: 45.2 % (ref 40.0–52.0)
Hemoglobin: 15 g/dL (ref 13.0–18.0)
MCH: 28.4 pg (ref 26.0–34.0)
MCHC: 33.2 g/dL (ref 32.0–36.0)
MCV: 85.5 fL (ref 80.0–100.0)
Platelets: 217 10*3/uL (ref 150–440)
RBC: 5.29 MIL/uL (ref 4.40–5.90)
RDW: 14.3 % (ref 11.5–14.5)
WBC: 19.7 10*3/uL — AB (ref 3.8–10.6)

## 2015-09-07 LAB — LIPASE, BLOOD: LIPASE: 18 U/L (ref 11–51)

## 2015-09-07 MED ORDER — ONDANSETRON HCL 4 MG/2ML IJ SOLN
4.0000 mg | Freq: Once | INTRAMUSCULAR | Status: AC
  Administered 2015-09-07: 4 mg via INTRAVENOUS
  Filled 2015-09-07: qty 2

## 2015-09-07 MED ORDER — METRONIDAZOLE IN NACL 5-0.79 MG/ML-% IV SOLN
500.0000 mg | Freq: Once | INTRAVENOUS | Status: AC
  Administered 2015-09-07: 500 mg via INTRAVENOUS
  Filled 2015-09-07: qty 100

## 2015-09-07 MED ORDER — FENTANYL CITRATE (PF) 100 MCG/2ML IJ SOLN
100.0000 ug | Freq: Once | INTRAMUSCULAR | Status: AC
  Administered 2015-09-07: 100 ug via INTRAVENOUS
  Filled 2015-09-07: qty 2

## 2015-09-07 MED ORDER — CIPROFLOXACIN IN D5W 400 MG/200ML IV SOLN
400.0000 mg | Freq: Once | INTRAVENOUS | Status: AC
  Administered 2015-09-08: 400 mg via INTRAVENOUS
  Filled 2015-09-07: qty 200

## 2015-09-07 MED ORDER — SODIUM CHLORIDE 0.9 % IV BOLUS (SEPSIS)
1000.0000 mL | Freq: Once | INTRAVENOUS | Status: AC
  Administered 2015-09-07: 1000 mL via INTRAVENOUS

## 2015-09-07 MED ORDER — IOHEXOL 240 MG/ML SOLN
25.0000 mL | INTRAMUSCULAR | Status: AC
  Administered 2015-09-07 – 2015-09-08 (×2): 25 mL via ORAL

## 2015-09-07 NOTE — ED Provider Notes (Signed)
[facility] Emergency Department Provider Note  ____________________________________________  Time seen: Approximately 11:16 PM  I have reviewed the triage vital signs and the nursing notes.   HISTORY  Chief Complaint Abdominal Pain    HPI Adam Pugh is a 35 y.o. male comes into the hospital today with abdominal pain. The patient reports that the pain started last week but it has gotten worse. He reports that it got worse on Friday and he's been in bed all weekend. He reports that its lower abdominal pain that's 9 out of 10 in intensity. He reports that he feels sharp pain on the right and swollen on the left. The patient has not had a bowel movement in the last couple of days and he reports he has been drinking water but not eating much. The patient also reports he has some midabdominal pain. He was diagnosed with diverticulitis back at the beginning of December but he is no longer taking any of his medications. He reports that he had been given Percocet for pain but it had not been helping. The patient reports that he's had some nausea with no vomiting and that it hurts to urinate. The patient denies any fever but his temperature was 99 and he has not had any chills. He reports that when he was here previously morphine did not work for him only Dilaudid.   Past Medical History  Diagnosis Date  . Inflammatory bowel disease (Crohn's disease) [facility])     Patient Active Problem List   Diagnosis Date Noted  . 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 Leukocytosis 09/08/2015  . Sepsis (HCC) 09/08/2015  . IBD (inflammatory bowel disease) 09/08/2015  . Hematuria 09/08/2015  . Lower abdominal pain     Past Surgical History  Procedure Laterality Date  . No past surgeries      No current outpatient prescriptions on file.  Allergies Review of patient's allergies indicates no known  allergies.  Family History  Problem Relation Age of Onset  . Glaucoma Father   . Diabetes Other     Social History Social History  Substance Use Topics  . Smoking status: Current Every Day Smoker -- 0.25 packs/day    Types: Cigarettes  . Smokeless tobacco: Never Used  . Alcohol Use: 0.0 oz/week    0 Standard drinks or equivalent per week     Comment: occ    Review of Systems Constitutional: No fever/chills Eyes: No visual changes. ENT: No sore throat. Cardiovascular: Denies chest pain. Respiratory: Denies shortness of breath. Gastrointestinal:  abdominal pain.   nausea, no vomiting.  No diarrhea.  No constipation. Genitourinary: Negative for dysuria. Musculoskeletal: Negative for back pain. Skin: Negative for rash. Neurological: Negative for headaches, focal weakness or numbness.  10-point ROS otherwise negative.  ____________________________________________   PHYSICAL EXAM:  VITAL SIGNS: ED Triage Vitals  Enc Vitals Group     BP 09/07/15 2144 139/94 mmHg     Pulse Rate 09/07/15 2144 108     Resp 09/07/15 2144 19     Temp 09/07/15 2144 98.2 F (36.8 C)     Temp Source 09/07/15 2144 Oral     SpO2 09/07/15 2144 99 %     Weight 09/07/15 2144 223 lb (101.152 kg)     Height 09/07/15 2144 6\' 2"  (1.88 m)     Head Cir --      Peak Flow --      Pain Score  09/07/15 2154 9     Pain Loc --      Pain Edu? --      Excl. in GC? --     Constitutional: Alert and oriented. Well appearing and in moderate distress. Eyes: Conjunctivae are normal. PERRL. EOMI. Head: Atraumatic. Nose: No congestion/rhinnorhea. Mouth/Throat: Mucous membranes are moist.  Oropharynx non-erythematous. Cardiovascular: Normal rate, regular rhythm. Grossly normal heart sounds.  Good peripheral circulation. Respiratory: Normal respiratory effort.  No retractions. Lungs CTAB. Gastrointestinal: Soft with epigastric and bilateral lower abdominal tenderness to palpation No distention. Positive bowel  sounds Musculoskeletal: No lower extremity tenderness nor edema.   Neurologic:  Normal speech and language.  Skin:  Skin is warm, dry and intact.  Psychiatric: Mood and affect are normal.   ____________________________________________   LABS (all labs ordered are listed, but only abnormal results are displayed)  Labs Reviewed  COMPREHENSIVE METABOLIC PANEL - Abnormal; Notable for the following:    Sodium 130 (*)    Chloride 95 (*)    Creatinine, Ser 1.28 (*)    Calcium 8.8 (*)    AST 12 (*)    ALT 14 (*)    All other components within normal limits  CBC - Abnormal; Notable for the following:    WBC 19.7 (*)    All other components within normal limits  URINALYSIS COMPLETEWITH MICROSCOPIC ([facility] ONLY) - Abnormal; Notable for the following:    Color, Urine YELLOW (*)    APPearance CLEAR (*)    Ketones, ur 2+ (*)    Hgb urine dipstick 3+ (*)    Leukocytes, UA 1+ (*)    Bacteria, UA RARE (*)    All other components within normal limits  BASIC METABOLIC PANEL - Abnormal; Notable for the following:    Calcium 8.0 (*)    All other components within normal limits  CBC - Abnormal; Notable for the following:    WBC 15.6 (*)    Hemoglobin 12.8 (*)    HCT 38.1 (*)    RDW 14.6 (*)    All other components within normal limits  URINE CULTURE  LIPASE, BLOOD  LACTIC ACID, PLASMA   ____________________________________________  EKG  None ____________________________________________  RADIOLOGY  CT abdomen and pelvis: Sigmoid diverticulitis with focally contained microperforation and early abscess formation, an ill-defined tract flu like structure between the sigmoid colon and adjacent loop of small bowel may represent a fistula. ____________________________________________   PROCEDURES  Procedure(s) performed: None  Critical Care performed: No  ____________________________________________   INITIAL IMPRESSION / ASSESSMENT AND PLAN / ED COURSE  Pertinent labs & imaging  results that were available during my care of the patient were reviewed by me and considered in my medical decision making (see chart for details).  This is a 35 year old male who comes in to the hospital today with lower abdominal pain. The patient previously has had some diverticulitis but he is complaining of some right-sided pain as well as epigastric pain. I will give the patient a liter of normal saline as he does have some hyponatremia and elevated creatinine. The patient also has some blood in his urine. Given the elevated white blood cell count of 19.7 and the patient's pain on his right lower quadrant and feels that I need to perform a CT scan to ensure that the patient does not have appendicitis or an abscess where he has his diverticulitis. I will give the patient a liter of normal saline and some fentanyl.  I initially contacted surgery to have  the patient evaluated but they report that this is the same way his CT scan looked previously. They feel that the patient needs to have a colonoscopy to be done by GI. Given the patient's continued pain as he received a second dose of fentanyl and a dose of Dilaudid I will admit the patient to the hospitalist for IV antibiotics and further evaluation. Surgery will follow the patient. ____________________________________________   FINAL CLINICAL IMPRESSION(S) / ED DIAGNOSES  Final diagnoses:  Diverticulitis of large intestine with perforation and abscess without bleeding      Rebecka Apley, MD 09/08/15 [phone]

## 2015-09-07 NOTE — ED Notes (Signed)
Pt presents w/ c/o lower abdominal pain. Pt recently diagnosed w/ diverticulitis and has finished the abx that were prescribed. Pt states he has not been able to f/u w/ PCP yet. Pt states pain medications that were rx'd for him did not work. Pt c/o recurrence of abdominal pain in LLQ. Pt c/o constipation x 2 days.

## 2015-09-08 ENCOUNTER — Emergency Department: Payer: Medicaid Other

## 2015-09-08 DIAGNOSIS — R319 Hematuria, unspecified: Secondary | ICD-10-CM | POA: Diagnosis present

## 2015-09-08 DIAGNOSIS — F17210 Nicotine dependence, cigarettes, uncomplicated: Secondary | ICD-10-CM | POA: Diagnosis present

## 2015-09-08 DIAGNOSIS — D72829 Elevated white blood cell count, unspecified: Secondary | ICD-10-CM | POA: Insufficient documentation

## 2015-09-08 DIAGNOSIS — Z833 Family history of diabetes mellitus: Secondary | ICD-10-CM | POA: Diagnosis not present

## 2015-09-08 DIAGNOSIS — K6389 Other specified diseases of intestine: Secondary | ICD-10-CM

## 2015-09-08 DIAGNOSIS — Z792 Long term (current) use of antibiotics: Secondary | ICD-10-CM | POA: Diagnosis not present

## 2015-09-08 DIAGNOSIS — K5780 Diverticulitis of intestine, part unspecified, with perforation and abscess without bleeding: Secondary | ICD-10-CM | POA: Diagnosis present

## 2015-09-08 DIAGNOSIS — A419 Sepsis, unspecified organism: Secondary | ICD-10-CM | POA: Diagnosis present

## 2015-09-08 DIAGNOSIS — F1490 Cocaine use, unspecified, uncomplicated: Secondary | ICD-10-CM | POA: Diagnosis present

## 2015-09-08 DIAGNOSIS — Z8669 Personal history of other diseases of the nervous system and sense organs: Secondary | ICD-10-CM | POA: Diagnosis not present

## 2015-09-08 DIAGNOSIS — N390 Urinary tract infection, site not specified: Secondary | ICD-10-CM | POA: Diagnosis present

## 2015-09-08 DIAGNOSIS — E860 Dehydration: Secondary | ICD-10-CM | POA: Diagnosis present

## 2015-09-08 DIAGNOSIS — K632 Fistula of intestine: Secondary | ICD-10-CM | POA: Diagnosis present

## 2015-09-08 DIAGNOSIS — K5720 Diverticulitis of large intestine with perforation and abscess without bleeding: Secondary | ICD-10-CM | POA: Diagnosis not present

## 2015-09-08 DIAGNOSIS — N289 Disorder of kidney and ureter, unspecified: Secondary | ICD-10-CM | POA: Diagnosis present

## 2015-09-08 DIAGNOSIS — E871 Hypo-osmolality and hyponatremia: Secondary | ICD-10-CM | POA: Diagnosis present

## 2015-09-08 DIAGNOSIS — K5090 Crohn's disease, unspecified, without complications: Secondary | ICD-10-CM | POA: Diagnosis present

## 2015-09-08 DIAGNOSIS — K529 Noninfective gastroenteritis and colitis, unspecified: Secondary | ICD-10-CM | POA: Diagnosis present

## 2015-09-08 LAB — CBC
HCT: 38.1 % — ABNORMAL LOW (ref 40.0–52.0)
Hemoglobin: 12.8 g/dL — ABNORMAL LOW (ref 13.0–18.0)
MCH: 28.7 pg (ref 26.0–34.0)
MCHC: 33.5 g/dL (ref 32.0–36.0)
MCV: 85.7 fL (ref 80.0–100.0)
PLATELETS: 177 10*3/uL (ref 150–440)
RBC: 4.44 MIL/uL (ref 4.40–5.90)
RDW: 14.6 % — AB (ref 11.5–14.5)
WBC: 15.6 10*3/uL — ABNORMAL HIGH (ref 3.8–10.6)

## 2015-09-08 LAB — BASIC METABOLIC PANEL
Anion gap: 5 (ref 5–15)
BUN: 11 mg/dL (ref 6–20)
CALCIUM: 8 mg/dL — AB (ref 8.9–10.3)
CHLORIDE: 104 mmol/L (ref 101–111)
CO2: 27 mmol/L (ref 22–32)
CREATININE: 1.02 mg/dL (ref 0.61–1.24)
GFR calc non Af Amer: >60 mL/min (ref >60)
Glucose, Bld: 85 mg/dL (ref 65–99)
Potassium: 3.7 mmol/L (ref 3.5–5.1)
SODIUM: 136 mmol/L (ref 135–145)

## 2015-09-08 LAB — LACTIC ACID, PLASMA: LACTIC ACID, VENOUS: 0.5 mmol/L (ref 0.5–2.0)

## 2015-09-08 MED ORDER — FENTANYL CITRATE (PF) 100 MCG/2ML IJ SOLN
INTRAMUSCULAR | Status: AC
  Administered 2015-09-08: 100 ug via INTRAVENOUS
  Filled 2015-09-08: qty 2

## 2015-09-08 MED ORDER — ENOXAPARIN SODIUM 40 MG/0.4ML ~~LOC~~ SOLN
40.0000 mg | Freq: Every day | SUBCUTANEOUS | Status: DC
Start: 2015-09-08 — End: 2015-09-10
  Administered 2015-09-08 – 2015-09-10 (×3): 40 mg via SUBCUTANEOUS
  Filled 2015-09-08 (×3): qty 0.4

## 2015-09-08 MED ORDER — PANTOPRAZOLE SODIUM 40 MG PO TBEC
40.0000 mg | DELAYED_RELEASE_TABLET | Freq: Every day | ORAL | Status: DC
  Administered 2015-09-08 – 2015-09-10 (×3): 40 mg via ORAL
  Filled 2015-09-08 (×3): qty 1

## 2015-09-08 MED ORDER — DOCUSATE SODIUM 100 MG PO CAPS
100.0000 mg | ORAL_CAPSULE | Freq: Two times a day (BID) | ORAL | Status: DC
  Administered 2015-09-08 – 2015-09-10 (×5): 100 mg via ORAL
  Filled 2015-09-08 (×4): qty 1

## 2015-09-08 MED ORDER — ACETAMINOPHEN 325 MG PO TABS: 650.0000 mg | ORAL_TABLET | Freq: Four times a day (QID) | ORAL | Status: DC | PRN

## 2015-09-08 MED ORDER — ONDANSETRON HCL 4 MG/2ML IJ SOLN: 4.0000 mg | Freq: Four times a day (QID) | INTRAMUSCULAR | Status: DC | PRN

## 2015-09-08 MED ORDER — SODIUM CHLORIDE 0.9 % IV SOLN
INTRAVENOUS | Status: DC
  Administered 2015-09-08: 04:00:00 via INTRAVENOUS

## 2015-09-08 MED ORDER — HYDROCODONE-ACETAMINOPHEN 5-325 MG PO TABS
1.0000 | ORAL_TABLET | ORAL | Status: DC | PRN
  Administered 2015-09-08 – 2015-09-09 (×4): 1 via ORAL
  Filled 2015-09-08 (×4): qty 1

## 2015-09-08 MED ORDER — HYDROMORPHONE HCL 1 MG/ML IJ SOLN
1.0000 mg | Freq: Once | INTRAMUSCULAR | Status: AC
  Administered 2015-09-08: 1 mg via INTRAVENOUS
  Filled 2015-09-08: qty 1

## 2015-09-08 MED ORDER — FENTANYL CITRATE (PF) 100 MCG/2ML IJ SOLN
100.0000 ug | Freq: Once | INTRAMUSCULAR | Status: AC
  Administered 2015-09-08: 100 ug via INTRAVENOUS

## 2015-09-08 MED ORDER — SODIUM CHLORIDE 0.9 % IJ SOLN
3.0000 mL | Freq: Two times a day (BID) | INTRAMUSCULAR | Status: DC
  Administered 2015-09-08 – 2015-09-10 (×4): 3 mL via INTRAVENOUS

## 2015-09-08 MED ORDER — HYDROMORPHONE HCL 1 MG/ML IJ SOLN
0.5000 mg | INTRAMUSCULAR | Status: DC | PRN
  Administered 2015-09-08 (×2): 0.5 mg via INTRAVENOUS
  Filled 2015-09-08: qty 1

## 2015-09-08 MED ORDER — SODIUM CHLORIDE 0.9 % IV BOLUS (SEPSIS)
1000.0000 mL | Freq: Once | INTRAVENOUS | Status: AC
  Administered 2015-09-08: 1000 mL via INTRAVENOUS

## 2015-09-08 MED ORDER — HYDROMORPHONE HCL 1 MG/ML IJ SOLN
1.0000 mg | INTRAMUSCULAR | Status: DC | PRN
  Administered 2015-09-08 – 2015-09-10 (×12): 1 mg via INTRAVENOUS
  Filled 2015-09-08 (×12): qty 1

## 2015-09-08 MED ORDER — ONDANSETRON HCL 4 MG PO TABS: 4.0000 mg | ORAL_TABLET | Freq: Four times a day (QID) | ORAL | Status: DC | PRN

## 2015-09-08 MED ORDER — CIPROFLOXACIN IN D5W 400 MG/200ML IV SOLN
400.0000 mg | Freq: Two times a day (BID) | INTRAVENOUS | Status: DC
  Administered 2015-09-08 – 2015-09-10 (×5): 400 mg via INTRAVENOUS
  Filled 2015-09-08 (×6): qty 200

## 2015-09-08 MED ORDER — HYDROMORPHONE HCL 1 MG/ML IJ SOLN
0.5000 mg | INTRAMUSCULAR | Status: DC | PRN
  Filled 2015-09-08: qty 1

## 2015-09-08 MED ORDER — ACETAMINOPHEN 650 MG RE SUPP: 650.0000 mg | Freq: Four times a day (QID) | RECTAL | Status: DC | PRN

## 2015-09-08 MED ORDER — IOHEXOL 300 MG/ML  SOLN
100.0000 mL | Freq: Once | INTRAMUSCULAR | Status: AC | PRN
Start: 2015-09-08 — End: 2015-09-08
  Administered 2015-09-08: 100 mL via INTRAVENOUS

## 2015-09-08 MED ORDER — METRONIDAZOLE IN NACL 5-0.79 MG/ML-% IV SOLN
500.0000 mg | Freq: Three times a day (TID) | INTRAVENOUS | Status: DC
  Administered 2015-09-08 – 2015-09-10 (×7): 500 mg via INTRAVENOUS
  Filled 2015-09-08 (×9): qty 100

## 2015-09-08 NOTE — ED Notes (Signed)
Surgeon and Admitting MD at bedside.

## 2015-09-08 NOTE — Progress Notes (Signed)
Initial Nutrition Assessment      INTERVENTION:  Meals and snacks: Cater to pt preferences Nutrition diet education: Pt requesting diet education.  Will provide education closer to discharge   NUTRITION DIAGNOSIS:   Inadequate oral intake related to altered GI function as evidenced by per patient/family report.    GOAL:   Patient will meet greater than or equal to 90% of their needs    MONITOR:    (Energy intake, Digestive system)  REASON FOR ASSESSMENT:   Malnutrition Screening Tool    ASSESSMENT:      Pt admitted with possible abscess, possible crohn's.    Past Medical History  Diagnosis Date  . Inflammatory bowel disease (Crohn's disease) (HCC)     Current Nutrition: tolerating clear liquids  Food/Nutrition-Related History: pt reports decreased intake 2-3 days prior to admission. Pt then reports intake not doing to good since admission in December. Unable to clarify intake   Scheduled Medications:  . ciprofloxacin  400 mg Intravenous Q12H  . docusate sodium  100 mg Oral BID  . enoxaparin (LOVENOX) injection  40 mg Subcutaneous Q0600  . metronidazole  500 mg Intravenous Q8H  . pantoprazole  40 mg Oral Daily  . sodium chloride  3 mL Intravenous Q12H       Electrolyte/Renal Profile and Glucose Profile:   Recent Labs Lab 09/07/15 2146 09/08/15 0504  NA 130* 136  K 3.5 3.7  CL 95* 104  CO2 27 27  BUN 13 11  CREATININE 1.28* 1.02  CALCIUM 8.8* 8.0*  GLUCOSE 98 85   Protein Profile:  Recent Labs Lab 09/07/15 2146  ALBUMIN 3.9    Gastrointestinal Profile: Last BM:1/3   Nutrition-Focused Physical Exam Findings: Nutrition-Focused physical exam completed. Findings are WDL for fat depletion, muscle depletion, and edema.     Weight Change: 6% wt loss in the last month    Diet Order:  Diet clear liquid Room service appropriate?: Yes; Fluid consistency:: Thin  Skin:   reviewed   Height:   Ht Readings from Last 1 Encounters:   09/07/15  (1.88 m)    Weight:   Wt Readings from Last 1 Encounters:  09/08/15 215 lb 1.6 oz (97.569 kg)    Ideal Body Weight:     BMI:  Body mass index is 27.61 kg/(m^2).  Estimated Nutritional Needs:   Kcal:  BEE 1999 kcals (IF 1.0-1.3, AF 1.3) 1610-9604 kcals/d   Protein:  (1.1-1.3 g/kg) 108-127 g/d  Fluid:  (25-78ml/kg) 2450-2915ml/d  EDUCATION NEEDS:   Education needs no appropriate at this time  MODERATE Care Level  Adam Pugh, RD, LDN [phone] (pager) Weekend/On-Call pager [phone])

## 2015-09-08 NOTE — H&P (Signed)
[facility] - [place] at [facility]   PATIENT NAME: Adam Pugh    MR#:  [id number]  DATE OF BIRTH:  1980-12-11  DATE OF ADMISSION:  09/07/2015  PRIMARY CARE PHYSICIAN: No PCP Per Patient   REQUESTING/REFERRING PHYSICIAN: Zenda Alpers, MD  CHIEF COMPLAINT:   Chief Complaint  Patient presents with  . Abdominal Pain    HISTORY OF PRESENT ILLNESS:  Adam Pugh  is a 35 y.o. male who presents with abdominal pain. Patient was hospitalized here about a month ago for similar presentation. At that time he was found to have what looks like possible diverticular disease with microperforation and possible fistula formation versus something more like Crohn's disease. He was given IV antibiotics and GI was consult in, who arranged for him to have follow-up in the outpatient setting. However, he was unable to make that follow-up appointment and now returns to the ED today with fever, GI pain, and was found to have leukocytosis and was tachycardic raising some concern for possible sepsis. CT abdomen is very similar, with radiology commenting on an appearance of possible diverticular disease with microperforation and possible fistula, though now with fluid collection concerning for possible abscess. However, given the chronicity of his symptoms is very possible he may have more chronic disease such as Crohn's/IBD. Surgery evaluated him in the ED and is following along, though there is no need for immediate surgery at this time. Hospitalists were called for admission. Of note, patient also had fair amount of microscopic hematuria on his UA, and states that he has had some small amount of dysuria, though this is now improving.  PAST MEDICAL HISTORY:   Past Medical History  Diagnosis Date  . Diverticulitis     PAST SURGICAL HISTORY:   Past Surgical History  Procedure Laterality Date  . No past surgeries      SOCIAL HISTORY:   Social History  Substance Use Topics  .  Smoking status: Current Every Day Smoker -- 0.25 packs/day    Types: Cigarettes  . Smokeless tobacco: Never Used  . Alcohol Use: 0.0 oz/week    0 Standard drinks or equivalent per week     Comment: occ    FAMILY HISTORY:   Family History  Problem Relation Age of Onset  . Glaucoma Father   . Diabetes Other     DRUG ALLERGIES:  No Known Allergies  MEDICATIONS AT HOME:   Prior to Admission medications   Medication Sig Start Date End Date Taking? Authorizing Provider  ciprofloxacin (CIPRO) 500 MG tablet Take 1 tablet (500 mg total) by mouth 2 (two) times daily. 08/08/15   Enid Baas, MD  metroNIDAZOLE (FLAGYL) 500 MG tablet Take 1 tablet (500 mg total) by mouth every 8 (eight) hours. 08/08/15   Enid Baas, MD  oxyCODONE-acetaminophen (PERCOCET/ROXICET) 5-325 MG tablet Take 1 tablet by mouth every 6 (six) hours as needed for moderate pain. 08/08/15   Enid Baas, MD    REVIEW OF SYSTEMS:  Review of Systems  Constitutional: Positive for fever. Negative for chills, weight loss and malaise/fatigue.  HENT: Negative for ear pain, hearing loss and tinnitus.   Eyes: Negative for blurred vision, double vision, pain and redness.  Respiratory: Negative for cough, hemoptysis and shortness of breath.   Cardiovascular: Negative for chest pain, palpitations, orthopnea and leg swelling.  Gastrointestinal: Positive for nausea and abdominal pain. Negative for vomiting, diarrhea and constipation.  Genitourinary: Positive for dysuria. Negative for frequency and hematuria.  Musculoskeletal: Negative for back pain,  joint pain and neck pain.  Skin:       No acne, rash, or lesions  Neurological: Negative for dizziness, tremors, focal weakness and weakness.  Endo/Heme/Allergies: Negative for polydipsia. Does not bruise/bleed easily.  Psychiatric/Behavioral: Negative for depression. The patient is not nervous/anxious and does not have insomnia.      VITAL SIGNS:   Filed Vitals:    09/07/15 2144 09/07/15 2359  BP: 139/94 143/91  Pulse: 108 96  Temp: 98.2 F (36.8 C)   TempSrc: Oral   Resp: 19 18  Height:  (1.88 m)   Weight: 101.152 kg (223 lb)   SpO2: 99% 99%   Wt Readings from Last 3 Encounters:  09/07/15 101.152 kg (223 lb)  08/03/15 104.327 kg (230 lb)  06/04/15 99.791 kg (220 lb)    PHYSICAL EXAMINATION:  Physical Exam  Vitals reviewed. Constitutional: He is oriented to person, place, and time. He appears well-developed and well-nourished. No distress.  HENT:  Head: Normocephalic and atraumatic.  Mouth/Throat: Oropharynx is clear and moist.  Eyes: Conjunctivae and EOM are normal. Pupils are equal, round, and reactive to light. No scleral icterus.  Neck: Normal range of motion. Neck supple. No JVD present. No thyromegaly present.  Cardiovascular: Normal rate, regular rhythm and intact distal pulses.  Exam reveals no gallop and no friction rub.   No murmur heard. Respiratory: Effort normal and breath sounds normal. No respiratory distress. He has no wheezes. He has no rales.  GI: Soft. Bowel sounds are normal. He exhibits no distension. There is tenderness (suprapubic and periumbilical tenderness).  Musculoskeletal: Normal range of motion. He exhibits no edema.  No arthritis, no gout  Lymphadenopathy:    He has no cervical adenopathy.  Neurological: He is alert and oriented to person, place, and time. No cranial nerve deficit.  No dysarthria, no aphasia  Skin: Skin is warm and dry. No rash noted. No erythema.  Psychiatric: He has a normal mood and affect. His behavior is normal. Judgment and thought content normal.    LABORATORY PANEL:   CBC  Recent Labs Lab 09/07/15 2146  WBC 19.7*  HGB 15.0  HCT 45.2  PLT 217   ------------------------------------------------------------------------------------------------------------------  Chemistries   Recent Labs Lab 09/07/15 2146  NA 130*  K 3.5  CL 95*  CO2 27  GLUCOSE 98  BUN 13   CREATININE 1.28*  CALCIUM 8.8*  AST 12*  ALT 14*  ALKPHOS 46  BILITOT 1.1   ------------------------------------------------------------------------------------------------------------------  Cardiac Enzymes No results for input(s): TROPONINI in the last 168 hours. ------------------------------------------------------------------------------------------------------------------  RADIOLOGY:  Ct Abdomen Pelvis W Contrast  09/08/2015  CLINICAL DATA:  35 year old male with lower abdominal pain. EXAM: CT ABDOMEN AND PELVIS WITH CONTRAST TECHNIQUE: Multidetector CT imaging of the abdomen and pelvis was performed using the standard protocol following bolus administration of intravenous contrast. CONTRAST:  OMNIPAQUE IOHEXOL 300 MG/ML  SOLN COMPARISON:  CT dated 08/06/2015 FINDINGS: The visualized lung bases are clear. No intra-abdominal free air. Trace free fluid may be present within the pelvis. The liver, gallbladder, pancreas, spleen, adrenal glands, kidneys appear unremarkable. There is mild urothelial haziness of the distal ureters and urinary bladder likely reactive to inflammatory changes of the bowel within the pelvis. The prostate and seminal vesicles are grossly unremarkable. There is sigmoid diverticulitis. There is diffuse inflammatory changes of the perisigmoid fat as well as stranding of the pelvic floor fat. Multiple small pockets of extraluminal gas noted within the pelvis likely representing focally contained microperforation is. There  is inflammatory changes and thickening of distal ileum within the pelvis, likely secondary to inflammatory changes of the sigmoid colon. A tract like structure containing pockets of air may be present between the sigmoid colon and adjacent distal small bowel concerning for fistulous formation. Evaluation of these loops of bowel is limited as they are not opacified with oral contrast. There is an ill-defined 3.3 x 2.0 cm fluid density with apparent  organized wall within the right hemipelvis concerning for developing abscess. Moderate stool noted within the colon. No evidence of bowel obstruction. Normal appendix. The abdominal aorta and IVC appear patent. No portal venous gas identified. There is no adenopathy. Small fat containing umbilical hernia. The osseous structures appear unremarkable. IMPRESSION: Sigmoid diverticulitis with focally contained microperforation and early abscess formation. An ill-defined tract flu-like structure between the sigmoid colon and adjacent loop of small bowel may represent a fistula. Evaluation of the distal small bowel and sigmoid is limited as this loops of bowel are not filled with oral contrast. Electronically Signed   By: Elgie Collard M.D.   On: 09/08/2015 01:33    EKG:   Orders placed or performed during the hospital encounter of 08/03/15  . ED EKG  . ED EKG  . EKG 12-Lead  . EKG 12-Lead    IMPRESSION AND PLAN:  Principal Problem:   Sepsis (HCC) - leukocytosis and tachycardia on presentation the ED. Likely intra-abdominal infection as a source. Started on Cipro and Flagyl in the ED. We'll continue these on admission and send blood and urine cultures. Other treatment as below. 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 After discussion with surgery and review of CT scan, it is felt that this patient may very likely have a more chronic disease process. We have consulted Surgery and gastroenterology, and appreciate their recommendations.   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 Surgery following along, we'll coordinate with them based on their recommendations.   Hematuria - unclear etiology at this time, whether related to his intra-abdominal  inflammatory process, possibly some UTI. UA was nitrite negative, though he did have 1+ leukocyte esterase along with his to numerous to count red cells. Patient is on Cipro for his intra-abdominal infection, urine cultures pending.   IBD (inflammatory bowel disease) - GI consult to help determine if the patient has Crohn's/IBD.  All the records are reviewed and case discussed with ED provider. Management plans discussed with the patient and/or family.  DVT PROPHYLAXIS: SubQ lovenox  GI PROPHYLAXIS: None  ADMISSION STATUS: Inpatient  CODE STATUS: Full  TOTAL TIME TAKING CARE OF THIS PATIENT: 50 minutes.    Deklyn Gibbon FIELDING 09/08/2015, 2:35 AM  [facility]  Office  [phone]  CC: Primary care physician; No PCP Per Patient

## 2015-09-08 NOTE — Progress Notes (Signed)
ANTIBIOTIC CONSULT NOTE - Follow Up  Pharmacy Consult for ciprofloxacin Indication: IAI  No Known Allergies  Patient Measurements: Height:  (188 cm) Weight: 215 lb 1.6 oz (97.569 kg) IBW/kg (Calculated) : 82.2  Vital Signs: Temp: 97.5 F (36.4 C) (01/09 0606) Temp Source: Oral (01/09 0606) BP: 114/67 mmHg (01/09 0606) Pulse Rate: 76 (01/09 0606) Intake/Output from previous day:   Intake/Output from this shift: Total I/O In: 619 [I.V.:419; IV Piggyback:200] Out: 500 [Urine:500]  Labs:  Recent Labs  09/07/15 2146 09/08/15 0504  WBC 19.7* 15.6*  HGB 15.0 12.8*  PLT 217 177  CREATININE 1.28* 1.02   Estimated Creatinine Clearance: 120.9 mL/min (by C-G formula based on Cr of 1.02).   Microbiology: No results found for this or any previous visit (from the past 720 hour(s)).  Medical History: Past Medical History  Diagnosis Date  . Inflammatory bowel disease (Crohn's disease) (HCC)     Medications:  Infusions:  . sodium chloride 100 mL/hr at 09/08/15 0350   Assessment: 34 yom cc abdominal pain/fever/chills. Has been on Cipro/Flagyl outpatient (current prescriptions per surgery notes). CT suggests Crohn's disease or IBD with fistula formation and now abscess as opposed to diverticular abscess. No urgent surgical need, pharmacy consulted to dose ciprofloxacin.  Plan:  Continue ciprofloxacin 400 mg IV Q12H for recommended duration of 7 days.  Pharmacy will continue to monitor for changes in renal function requiring dose change.   Roque Cash, PharmD Pharmacy Resident 09/08/2015

## 2015-09-08 NOTE — ED Notes (Signed)
Patient back from CT.

## 2015-09-08 NOTE — H&P (Signed)
Adam Pugh is an 35 y.o. male.   Chief Complaint: Abd pain, fever and chills HPI: 35 yr old male with concern for possible Crohn's disease/IBD, who comes in with increased abdominal pain that started a few days ago.  Patient states that on Friday he had a bowel movement that didn't feel quite normal, harder than usual.  Patient states his abdominal pain increased after that and he began to have fever/ chills on Saturday.  He states that he has had decreased energy level, nausea, decreased appetite and some pain and burning with urination, with darker urine as well.  Patient first started having this abdominal pains back in September and at first initially ? Diverticulitis verse inflammatory bowel disease.    Past Medical History  Diagnosis Date  . Inflammatory bowel disease (Crohn's disease) [facility])    Past Surgical History  Procedure Laterality Date  . No past surgeries     Family History  Problem Relation Age of Onset  . Glaucoma Father   . Diabetes Other   Inflammatory Bowel Disease----Mother   Social History   Social History  . Marital Status: Single    Spouse Name: N/A  . Number of Children: N/A  . Years of Education: N/A   Social History Main Topics  . Smoking status: Current Every Day Smoker -- 0.25 packs/day    Types: Cigarettes  . Smokeless tobacco: Never Used  . Alcohol Use: 0.0 oz/week    0 Standard drinks or equivalent per week     Comment: occ  . Drug Use: Yes    Special: Cocaine, Marijuana  . Sexual Activity: Yes    Birth Control/ Protection: None   Other Topics Concern  . None   Social History Narrative   No current facility-administered medications for this encounter.  Current outpatient prescriptions:  .  ciprofloxacin (CIPRO) 500 MG tablet, Take 1 tablet (500 mg total) by mouth 2 (two) times daily., Disp: 20 tablet, Rfl: 0 .  metroNIDAZOLE (FLAGYL) 500 MG tablet, Take 1 tablet (500 mg total) by mouth every 8 (eight) hours., Disp: 30 tablet, Rfl:  0 .  oxyCODONE-acetaminophen (PERCOCET/ROXICET) 5-325 MG tablet, Take 1 tablet by mouth every 6 (six) hours as needed for moderate pain., Disp: 20 tablet, Rfl: 0 No Known Allergies     Results for orders placed or performed during the hospital encounter of 09/07/15 (from the past 48 hour(s))  Lipase, blood     Status: None   Collection Time: 09/07/15  9:46 PM  Result Value Ref Range   Lipase 18 11 - 51 U/L  Comprehensive metabolic panel     Status: Abnormal   Collection Time: 09/07/15  9:46 PM  Result Value Ref Range   Sodium 130 (L) 135 - 145 mmol/L   Potassium 3.5 3.5 - 5.1 mmol/L   Chloride 95 (L) 101 - 111 mmol/L   CO2 27 22 - 32 mmol/L   Glucose, Bld 98 65 - 99 mg/dL   BUN 13 6 - 20 mg/dL   Creatinine, Ser 1.28 (H) 0.61 - 1.24 mg/dL   Calcium 8.8 (L) 8.9 - 10.3 mg/dL   Total Protein 7.8 6.5 - 8.1 g/dL   Albumin 3.9 3.5 - 5.0 g/dL   AST 12 (L) 15 - 41 U/L   ALT 14 (L) 17 - 63 U/L   Alkaline Phosphatase 46 38 - 126 U/L   Total Bilirubin 1.1 0.3 - 1.2 mg/dL   GFR calc non Af Amer >60 >60 mL/min  GFR calc Af Amer >60 >60 mL/min    Comment: (NOTE) The eGFR has been calculated using the CKD EPI equation. This calculation has not been validated in all clinical situations. eGFR's persistently <60 mL/min signify possible Chronic Kidney Disease.    Anion gap 8 5 - 15  CBC     Status: Abnormal   Collection Time: 09/07/15  9:46 PM  Result Value Ref Range   WBC 19.7 (H) 3.8 - 10.6 K/uL   RBC 5.29 4.40 - 5.90 MIL/uL   Hemoglobin 15.0 13.0 - 18.0 g/dL   HCT 45.2 40.0 - 52.0 %   MCV 85.5 80.0 - 100.0 fL   MCH 28.4 26.0 - 34.0 pg   MCHC 33.2 32.0 - 36.0 g/dL   RDW 14.3 11.5 - 14.5 %   Platelets 217 150 - 440 K/uL  Urinalysis complete, with microscopic ([facility] only)     Status: Abnormal   Collection Time: 09/07/15  9:46 PM  Result Value Ref Range   Color, Urine YELLOW (A) YELLOW   APPearance CLEAR (A) CLEAR   Glucose, UA NEGATIVE NEGATIVE mg/dL   Bilirubin Urine NEGATIVE  NEGATIVE   Ketones, ur 2+ (A) NEGATIVE mg/dL   Specific Gravity, Urine 1.019 1.005 - 1.030   Hgb urine dipstick 3+ (A) NEGATIVE   pH 6.0 5.0 - 8.0   Protein, ur NEGATIVE NEGATIVE mg/dL   Nitrite NEGATIVE NEGATIVE   Leukocytes, UA 1+ (A) NEGATIVE   RBC / HPF TOO NUMEROUS TO COUNT 0 - 5 RBC/hpf   WBC, UA 0-5 0 - 5 WBC/hpf   Bacteria, UA RARE (A) NONE SEEN   Squamous Epithelial / LPF NONE SEEN NONE SEEN   Mucous PRESENT    Ct Abdomen Pelvis W Contrast  09/08/2015  CLINICAL DATA:  35 year old male with lower abdominal pain. EXAM: CT ABDOMEN AND PELVIS WITH CONTRAST TECHNIQUE: Multidetector CT imaging of the abdomen and pelvis was performed using the standard protocol following bolus administration of intravenous contrast. CONTRAST:  165m OMNIPAQUE IOHEXOL 300 MG/ML  SOLN COMPARISON:  CT dated 08/06/2015 FINDINGS: The visualized lung bases are clear. No intra-abdominal free air. Trace free fluid may be present within the pelvis. The liver, gallbladder, pancreas, spleen, adrenal glands, kidneys appear unremarkable. There is mild urothelial haziness of the distal ureters and urinary bladder likely reactive to inflammatory changes of the bowel within the pelvis. The prostate and seminal vesicles are grossly unremarkable. There is sigmoid diverticulitis. There is diffuse inflammatory changes of the perisigmoid fat as well as stranding of the pelvic floor fat. Multiple small pockets of extraluminal gas noted within the pelvis likely representing focally contained microperforation is. There is inflammatory changes and thickening of distal ileum within the pelvis, likely secondary to inflammatory changes of the sigmoid colon. A tract like structure containing pockets of air may be present between the sigmoid colon and adjacent distal small bowel concerning for fistulous formation. Evaluation of these loops of bowel is limited as they are not opacified with oral contrast. There is an ill-defined 3.3 x 2.0 cm fluid  density with apparent organized wall within the right hemipelvis concerning for developing abscess. Moderate stool noted within the colon. No evidence of bowel obstruction. Normal appendix. The abdominal aorta and IVC appear patent. No portal venous gas identified. There is no adenopathy. Small fat containing umbilical hernia. The osseous structures appear unremarkable. IMPRESSION: Sigmoid diverticulitis with focally contained microperforation and early abscess formation. An ill-defined tract flu-like structure between the sigmoid colon and adjacent loop of  small bowel may represent a fistula. Evaluation of the distal small bowel and sigmoid is limited as this loops of bowel are not filled with oral contrast. Electronically Signed   By: Anner Crete M.D.   On: 09/08/2015 01:33    ROS  Blood pressure 137/81, pulse 89, temperature 98.2 F (36.8 C), temperature source Oral, resp. rate 18, height 6' 2" (1.88 m), weight 223 lb (101.152 kg), SpO2 99 %. Physical Exam   Assessment/Plan 35 yr old male with abdominal pain and fever.  I have personally reviewed his past records including his visits from 05/2015 and back in 07/2015.  I have personally reviewed his laboratory values of increased WBC and slightly increased Cr.  I have personally reviewed his CT scan images from this visit and last, showing inflammation in the distal small bowel with inflammation in the sigmoid colon and now with small fluid collection, some dots of air surrounding the two inflamed portions ? Of fistula which was partially present on scan from December as well.  I have also reviewed the radiology reads.  Given the chronicity, as well as family history of inflammatory bowel disease, would lean more toward this area being a Crohn's or inflammatory bowel disease with fistula formation and now abscess; as opposed to diverticular abscess.  Will continue to follow along with medicine and ensure patient does not change to needing any  immediate surgical intervention.  Agree with antibiotics and bowel rest.    Lavona Mound Loflin 09/08/2015, 2:56 AM

## 2015-09-08 NOTE — Consult Note (Signed)
GI Inpatient Consult Note [name] MD  Reason for Consult: History of possible diverticular disease versus Crohn's disease with abscess and microperforation    Attending Requesting Consult: Dr. Oralia Manis  Outpatient Primary Physician: No primary care provider  History of Present Illness: Adam Pugh is a 35 y.o. male who is admitted to the hospital with complaint of lower abdominal pain. He was also seen for this same complaint about a month ago undergoing CT scan and GI consultation with surgical consultation at that time as well. He did have an appointment set up for follow-up outpatient however did not keep those appointments due to financial reasons. He presents with lower abdominal discomfort. Previous evaluation he was shown to have an abnormal CT scan including diverticular disease as well as pericolonic stranding with apparent fistular disease.  On presentation at this hospitalization patient states he has been having lower abdominal pain mostly in the right lower quadrant. Fistular disease and inflammation in the colon is basically sigmoid although fistula may connect with other enteric structures. He states that he is not feeling as bad as he did on his last hospitalization however when he completed his outpatient antibiotic course symptoms seem to be coming back. He has not noted any air or stool in the urine. He states he finished his antibiotics several days ago. He has been having nausea for about 3 days prior to admission but no vomiting. There is no radiation of the lower abdominal pain. He has a bowel movement usually daily though some increased constipation in the past several days. There is no heartburn or dysphagia. He takes no NSAIDs or aspirin. He denies any black stools blood in the stools or slimy stools. He has had no abdominal surgeries or injuries.  Patient states he is feeling better since starting the antibiotics in the hospital. Past Medical History:   Past Medical History  Diagnosis Date  . Inflammatory bowel disease (Crohn's disease) (HCC)     Problem List: Patient Active Problem List   Diagnosis Date Noted  . 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 Leukocytosis 09/08/2015  . Sepsis (HCC) 09/08/2015  . IBD (inflammatory bowel disease) 09/08/2015  . Hematuria 09/08/2015  . Lower abdominal pain     Past Surgical History: Past Surgical History  Procedure Laterality Date  . No past surgeries      Allergies: No Known Allergies  Home Medications: Prescriptions prior to admission  Medication Sig Dispense Refill Last Dose  . oxyCODONE-acetaminophen (PERCOCET/ROXICET) 5-325 MG tablet Take 1 tablet by mouth every 6 (six) hours as needed for moderate pain. 20 tablet 0 08/29/2015   Home medication reconciliation was completed with the patient.   Scheduled Inpatient Medications:   . ciprofloxacin  400 mg Intravenous Q12H  . docusate sodium  100 mg Oral BID  . enoxaparin (LOVENOX) injection  40 mg Subcutaneous Q0600  . metronidazole  500 mg Intravenous Q8H  . pantoprazole  40 mg Oral Daily  . sodium chloride  3 mL Intravenous Q12H    Continuous Inpatient Infusions:     PRN Inpatient Medications:  acetaminophen **OR** acetaminophen, HYDROcodone-acetaminophen, HYDROmorphone (DILAUDID) injection, ondansetron **OR** ondansetron (ZOFRAN) IV  Family History: family history includes Diabetes in his other; Glaucoma in his father.   GI Family History: Negative for colorectal cancer liver disease or ulcers, negative for inflammatory bowel disease to his knowledge.  Social History:   reports that he has been smoking Cigarettes.  He has been smoking about 0.25 packs per day. He has never used smokeless tobacco. He reports that he drinks alcohol. 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 frequent alcohol use, recent cocaine use.  ROS  Review of  Systems: Since his surgery reviewed per admission history and physical agree with same.  Physical Examination: BP 125/71 mmHg  Pulse 76  Temp(Src) 97.6 F (36.4 C) (Oral)  Resp 17  Ht 6\' 2"  (1.88 m)  Wt 97.569 kg (215 lb 1.6 oz)  BMI 27.61 kg/m2  SpO2 47% Gen: 35 year old male no acute distress HEENT: Norm cephalic atraumatic eyes are anicteric Neck: Supple no JVD no lymphadenopathy no thyromegaly Chest: Bilaterally clear to auscultation CV: Regular rate and rhythm without rub or gallop Abd: Soft nondistended he has a generalized abdominal tenderness though mostly in the lower quadrants. Mostly more so toward midline and left lower quadrant to palpation. There are no masses or rebound and bowel sounds are positive and normoactive. Ext: No clubbing cyanosis or edema Skin: Warm and dry Other:  Data: Lab Results  Component Value Date   WBC 15.6* 09/08/2015   HGB 12.8* 09/08/2015   HCT 38.1* 09/08/2015   MCV 85.7 09/08/2015   PLT 177 09/08/2015    Recent Labs Lab 09/07/15 2146 09/08/15 0504  HGB 15.0 12.8*   Lab Results  Component Value Date   NA 136 09/08/2015   K 3.7 09/08/2015   CL 104 09/08/2015   CO2 27 09/08/2015   BUN 11 09/08/2015   CREATININE 1.02 09/08/2015   Lab Results  Component Value Date   ALT 14* 09/07/2015   AST 12* 09/07/2015   ALKPHOS 46 09/07/2015   BILITOT 1.1 09/07/2015   No results for input(s): APTT, INR, PTT in the last 168 hours. CBC Latest Ref Rng 09/08/2015 09/07/2015 08/07/2015  WBC 3.8 - 10.6 K/uL 15.6(H) 19.7(H) 8.4  Hemoglobin 13.0 - 18.0 g/dL 12.8(L) 15.0 11.8(L)  Hematocrit 40.0 - 52.0 % 38.1(L) 45.2 35.1(L)  Platelets 150 - 440 K/uL 177 217 188    STUDIES: Ct Abdomen Pelvis W Contrast  09/08/2015  CLINICAL DATA:  35 year old male with lower abdominal pain. EXAM: CT ABDOMEN AND PELVIS WITH CONTRAST TECHNIQUE: Multidetector CT imaging of the abdomen and pelvis was performed using the standard protocol following bolus  administration of intravenous contrast. CONTRAST:  OMNIPAQUE IOHEXOL 300 MG/ML  SOLN COMPARISON:  CT dated 08/06/2015 FINDINGS: The visualized lung bases are clear. No intra-abdominal free air. Trace free fluid may be present within the pelvis. The liver, gallbladder, pancreas, spleen, adrenal glands, kidneys appear unremarkable. There is mild urothelial haziness of the distal ureters and urinary bladder likely reactive to inflammatory changes of the bowel within the pelvis. The prostate and seminal vesicles are grossly unremarkable. There is sigmoid diverticulitis. There is diffuse inflammatory changes of the perisigmoid fat as well as stranding of the pelvic floor fat. Multiple small pockets of extraluminal gas noted within the pelvis likely representing focally contained microperforation is. There is inflammatory changes and thickening of distal ileum within the pelvis, likely secondary to inflammatory changes of the sigmoid colon. A tract like structure containing pockets of air may be present between the sigmoid colon and adjacent distal small bowel concerning for fistulous formation. Evaluation of these loops of bowel is limited as they are not opacified with oral contrast. There is an ill-defined 3.3 x 2.0 cm fluid density with apparent organized wall within the right hemipelvis concerning for developing abscess. Moderate stool noted  within the colon. No evidence of bowel obstruction. Normal appendix. The abdominal aorta and IVC appear patent. No portal venous gas identified. There is no adenopathy. Small fat containing umbilical hernia. The osseous structures appear unremarkable. IMPRESSION: Sigmoid diverticulitis with focally contained microperforation and early abscess formation. An ill-defined tract flu-like structure between the sigmoid colon and adjacent loop of small bowel may represent a fistula. Evaluation of the distal small bowel and sigmoid is limited as this loops of bowel are not filled  with oral contrast. Electronically Signed   By: Elgie Collard M.D.   On: 09/08/2015 01:33   @  Assessment: 1. Lower abdominal pain with abnormal CT scan. Differential diagnosis does include both diverticular disease as well as inflammatory bowel disease such as Crohn's. Both of these etiologies can result in fistula formations. It is of note that on his last hospitalization and inflammatory bowel disease panel was done showing negative for IgA A and IgG of Saccharomyces cerevasiae and negative for atypical p-ANCA. These are both markers of inflammatory bowel disease, ulcerative colitis and Crohn's. It is also of note that his C-reactive protein and sedimentation rate were normal on his last admission but elevated over the course of the admission.  Recommendations: 1. Continue current medications. At some point will need to have luminal evaluation, I would probably do either a CT colonography or barium enema study prior to considering colonoscopy however would wait on luminal evaluation until completion of new antibiotic course and repeat CT scan to verify no free air prior to any luminal evaluation. Further evaluation can be done as outpatient. I stressed to patient that he needs to make sure he follows up as recommended for further evaluation. If these evaluations are uninformative consideration for possible diagnostic laparoscopy may be of benefit. He apparently currently has an appointment set up to see Dr. Servando Snare in follow-up. Case also discussed with Dr. Excell Seltzer.  Will follow with you.  Thank you for the consult. Please call with questions or concerns.  [name], MD  09/08/2015 3:54 PM

## 2015-09-08 NOTE — Progress Notes (Signed)
ANTIBIOTIC CONSULT NOTE - INITIAL  Pharmacy Consult for ciprofloxacin Indication: IAI  No Known Allergies  Patient Measurements: Height:  (188 cm) Weight: 223 lb (101.152 kg) IBW/kg (Calculated) : 82.2 Adjusted Body Weight:   Vital Signs: Temp: 98.2 F (36.8 C) (01/08 2144) Temp Source: Oral (01/08 2144) BP: 137/81 mmHg (01/09 0235) Pulse Rate: 89 (01/09 0235) Intake/Output from previous day:   Intake/Output from this shift:    Labs:  Recent Labs  09/07/15 2146  WBC 19.7*  HGB 15.0  PLT 217  CREATININE 1.28*   Estimated Creatinine Clearance: 105.2 mL/min (by C-G formula based on Cr of 1.28). No results for input(s): VANCOTROUGH, VANCOPEAK, VANCORANDOM, GENTTROUGH, GENTPEAK, GENTRANDOM, TOBRATROUGH, TOBRAPEAK, TOBRARND, AMIKACINPEAK, AMIKACINTROU, AMIKACIN in the last 72 hours.   Microbiology: No results found for this or any previous visit (from the past 720 hour(s)).  Medical History: Past Medical History  Diagnosis Date  . Inflammatory bowel disease (Crohn's disease) (HCC)     Medications:  Infusions:  . sodium chloride     Assessment: 34 yom cc abdominal pain/fever/chills. Has been on Cipro/Flagyl outpatient (current prescriptions per surgery notes). CT suggests Crohn's disease or IBD with fistula formation and now abscess as opposed to diverticular abscess. No urgent surgical need, pharmacy consulted to dose ciprofloxacin.  Goal of Therapy:    Plan:  Ciprofloxacin 400 mg IV Q12H.  Carola Frost, Pharm.D., BPCS Clinical Pharmacist 09/08/2015,3:33 AM

## 2015-09-08 NOTE — Progress Notes (Signed)
CC: Abdominal pain Subjective: This patient known to me from prior admission and I discussed this care with Dr. Orvis Brill. Patient states that he has abdominal pain which is about the same as when he was admitted. He failed to keep his appointments for outpatient workup with me and with Dr.Wohl. He denies fevers or chills and no nausea. Objective: Vital signs in last 24 hours: Temp:  [97.5 F (36.4 C)-98.2 F (36.8 C)] 97.5 F (36.4 C) (01/09 0606) Pulse Rate:  [76-108] 76 (01/09 0606) Resp:  [17-19] 17 (01/09 0606) BP: (114-143)/(67-94) 114/67 mmHg (01/09 0606) SpO2:  [97 %-100 %] 97 % (01/09 0606) Weight:  [215 lb 1.6 oz (97.569 kg)-223 lb (101.152 kg)] 215 lb 1.6 oz (97.569 kg) (01/09 0339) Last BM Date: 09/12/15  Intake/Output from previous day:   Intake/Output this shift: Total I/O In: 519 [I.V.:419; IV Piggyback:100] Out: 500 [Urine:500]  Physical exam:  Abdomen is soft and minimally distended and tender in the lower quadrants no peritoneal signs  Lab Results: CBC   Recent Labs  09/07/15 2146 09/08/15 0504  WBC 19.7* 15.6*  HGB 15.0 12.8*  HCT 45.2 38.1*  PLT 217 177   BMET  Recent Labs  09/07/15 2146 09/08/15 0504  NA 130* 136  K 3.5 3.7  CL 95* 104  CO2 27 27  GLUCOSE 98 85  BUN 13 11  CREATININE 1.28* 1.02  CALCIUM 8.8* 8.0*   PT/INR No results for input(s): LABPROT, INR in the last 72 hours. ABG No results for input(s): PHART, HCO3 in the last 72 hours.  Invalid input(s): PCO2, PO2  Studies/Results: Ct Abdomen Pelvis W Contrast  09/08/2015  CLINICAL DATA:  35 year old male with lower abdominal pain. EXAM: CT ABDOMEN AND PELVIS WITH CONTRAST TECHNIQUE: Multidetector CT imaging of the abdomen and pelvis was performed using the standard protocol following bolus administration of intravenous contrast. CONTRAST:  OMNIPAQUE IOHEXOL 300 MG/ML  SOLN COMPARISON:  CT dated 08/06/2015 FINDINGS: The visualized lung bases are clear. No intra-abdominal  free air. Trace free fluid may be present within the pelvis. The liver, gallbladder, pancreas, spleen, adrenal glands, kidneys appear unremarkable. There is mild urothelial haziness of the distal ureters and urinary bladder likely reactive to inflammatory changes of the bowel within the pelvis. The prostate and seminal vesicles are grossly unremarkable. There is sigmoid diverticulitis. There is diffuse inflammatory changes of the perisigmoid fat as well as stranding of the pelvic floor fat. Multiple small pockets of extraluminal gas noted within the pelvis likely representing focally contained microperforation is. There is inflammatory changes and thickening of distal ileum within the pelvis, likely secondary to inflammatory changes of the sigmoid colon. A tract like structure containing pockets of air may be present between the sigmoid colon and adjacent distal small bowel concerning for fistulous formation. Evaluation of these loops of bowel is limited as they are not opacified with oral contrast. There is an ill-defined 3.3 x 2.0 cm fluid density with apparent organized wall within the right hemipelvis concerning for developing abscess. Moderate stool noted within the colon. No evidence of bowel obstruction. Normal appendix. The abdominal aorta and IVC appear patent. No portal venous gas identified. There is no adenopathy. Small fat containing umbilical hernia. The osseous structures appear unremarkable. IMPRESSION: Sigmoid diverticulitis with focally contained microperforation and early abscess formation. An ill-defined tract flu-like structure between the sigmoid colon and adjacent loop of small bowel may represent a fistula. Evaluation of the distal small bowel and sigmoid is limited as this loops  of bowel are not filled with oral contrast. Electronically Signed   By: Elgie Collard M.D.   On: 09/08/2015 01:33    Anti-infectives: Anti-infectives    Start     Dose/Rate Route Frequency Ordered Stop    09/08/15 1000  ciprofloxacin (CIPRO) IVPB 400 mg     400 mg 200 mL/hr over 60 Minutes Intravenous Every 12 hours 09/08/15 0332     09/08/15 0800  metroNIDAZOLE (FLAGYL) IVPB 500 mg     500 mg 100 mL/hr over 60 Minutes Intravenous Every 8 hours 09/08/15 0329     09/07/15 2330  ciprofloxacin (CIPRO) IVPB 400 mg     400 mg 200 mL/hr over 60 Minutes Intravenous  Once 09/07/15 2325 09/08/15 0236   09/07/15 2330  metroNIDAZOLE (FLAGYL) IVPB 500 mg     500 mg 100 mL/hr over 60 Minutes Intravenous  Once 09/07/15 2325 09/08/15 0043      Assessment/Plan:  Recommend IV antibiotics only. His CT scan is been reviewed and the abscess appears to be small enough to where it would not be amenable to drainage. Should it enlarge and a CT scan repeated later in the week when he may require drainage of a does not improve on IV antibiotics. He still requires a workup for the cause of this as the etiology is thought to be Crohn's disease but this has not been definitively investigated.  Lattie Haw, MD, FACS  09/08/2015

## 2015-09-08 NOTE — ED Notes (Signed)
CT made aware of patient completion of CT contrast solution.

## 2015-09-08 NOTE — ED Notes (Signed)
Patient transported to CT 

## 2015-09-08 NOTE — Progress Notes (Signed)
[facility] -  at [facility]   PATIENT NAME: Adam Pugh    MR#:  [id number]  DATE OF BIRTH:  09/12/1980  SUBJECTIVE: Have abdominal pain CHIEF COMPLAINT:   Chief Complaint  Patient presents with  . Abdominal Pain   Patient is a 35 year old African-American male with past medical history significant for history of suspected inflammatory bowel disease who came into the hospital with increasing abdominal pain. CT scan of his abdomen that revealed sigmoid diverticulitis with microperforation and early abscess formation. Patient's labs showed renal insufficiency, hyponatremia, leukocytosis. Patient was initiated on IV ciprofloxacin and Flagyl and was admitted to the hospital for further evaluation and therapy. He was initiated on Dilaudid. However, his pain control is not adequate, he requests some more pain medications. He admits of constipation and not having bowel movements over the past 3 days Review of Systems  Constitutional: Negative for fever, chills and weight loss.  HENT: Negative for congestion.   Eyes: Negative for blurred vision and double vision.  Respiratory: Negative for cough, sputum production, shortness of breath and wheezing.   Cardiovascular: Negative for chest pain, palpitations, orthopnea, leg swelling and PND.  Gastrointestinal: Positive for abdominal pain and constipation. Negative for nausea, vomiting, diarrhea and blood in stool.  Genitourinary: Negative for dysuria, urgency, frequency and hematuria.  Musculoskeletal: Negative for falls.  Neurological: Negative for dizziness, tremors, focal weakness and headaches.  Endo/Heme/Allergies: Does not bruise/bleed easily.  Psychiatric/Behavioral: Negative for depression. The patient does not have insomnia.     VITAL SIGNS: Blood pressure 125/71, pulse 76, temperature 97.6 F (36.4 C), temperature source Oral, resp. rate 17, height 6\' 2"  (1.88 m), weight 97.569 kg (215 lb 1.6 oz), SpO2  98 %.  PHYSICAL EXAMINATION:   GENERAL:  35 y.o.-year-old patient lying in the bed in mild distress due to abdominal pain.  EYES: Pupils equal, round, reactive to light and accommodation. No scleral icterus. Extraocular muscles intact.  HEENT: Head atraumatic, normocephalic. Oropharynx and nasopharynx clear.  NECK:  Supple, no jugular venous distention. No thyroid enlargement, no tenderness.  LUNGS: Normal breath sounds bilaterally, no wheezing, rales,rhonchi or crepitation. No use of accessory muscles of respiration.  CARDIOVASCULAR: S1, S2 normal. No murmurs, rubs, or gallops.  ABDOMEN: Soft, diffusely tender but no rebound or guarding were noted, nondistended. Bowel sounds diminished. No organomegaly or mass.  EXTREMITIES: No pedal edema, cyanosis, or clubbing.  NEUROLOGIC: Cranial nerves II through XII are intact. Muscle strength 5/5 in all extremities. Sensation intact. Gait not checked.  PSYCHIATRIC: The patient is alert and oriented x 3.  SKIN: No obvious rash, lesion, or ulcer.   ORDERS/RESULTS REVIEWED:   CBC  Recent Labs Lab 09/07/15 2146 09/08/15 0504  WBC 19.7* 15.6*  HGB 15.0 12.8*  HCT 45.2 38.1*  PLT 217 177  MCV 85.5 85.7  MCH 28.4 28.7  MCHC 33.2 33.5  RDW 14.3 14.6*   ------------------------------------------------------------------------------------------------------------------  Chemistries   Recent Labs Lab 09/07/15 2146 09/08/15 0504  NA 130* 136  K 3.5 3.7  CL 95* 104  CO2 27 27  GLUCOSE 98 85  BUN 13 11  CREATININE 1.28* 1.02  CALCIUM 8.8* 8.0*  AST 12*  --   ALT 14*  --   ALKPHOS 46  --   BILITOT 1.1  --    ------------------------------------------------------------------------------------------------------------------ estimated creatinine clearance is 120.9 mL/min (by C-G formula based on Cr of 1.02). ------------------------------------------------------------------------------------------------------------------ No results for  input(s): TSH, T4TOTAL, T3FREE, THYROIDAB in the last 72 hours.  Invalid  input(s): FREET3  Cardiac Enzymes No results for input(s): CKMB, TROPONINI, MYOGLOBIN in the last 168 hours.  Invalid input(s): CK ------------------------------------------------------------------------------------------------------------------ Invalid input(s): POCBNP ---------------------------------------------------------------------------------------------------------------  RADIOLOGY: Ct Abdomen Pelvis W Contrast  09/08/2015  CLINICAL DATA:  35 year old male with lower abdominal pain. EXAM: CT ABDOMEN AND PELVIS WITH CONTRAST TECHNIQUE: Multidetector CT imaging of the abdomen and pelvis was performed using the standard protocol following bolus administration of intravenous contrast. CONTRAST:  OMNIPAQUE IOHEXOL 300 MG/ML  SOLN COMPARISON:  CT dated 08/06/2015 FINDINGS: The visualized lung bases are clear. No intra-abdominal free air. Trace free fluid may be present within the pelvis. The liver, gallbladder, pancreas, spleen, adrenal glands, kidneys appear unremarkable. There is mild urothelial haziness of the distal ureters and urinary bladder likely reactive to inflammatory changes of the bowel within the pelvis. The prostate and seminal vesicles are grossly unremarkable. There is sigmoid diverticulitis. There is diffuse inflammatory changes of the perisigmoid fat as well as stranding of the pelvic floor fat. Multiple small pockets of extraluminal gas noted within the pelvis likely representing focally contained microperforation is. There is inflammatory changes and thickening of distal ileum within the pelvis, likely secondary to inflammatory changes of the sigmoid colon. A tract like structure containing pockets of air may be present between the sigmoid colon and adjacent distal small bowel concerning for fistulous formation. Evaluation of these loops of bowel is limited as they are not opacified with oral contrast.  There is an ill-defined 3.3 x 2.0 cm fluid density with apparent organized wall within the right hemipelvis concerning for developing abscess. Moderate stool noted within the colon. No evidence of bowel obstruction. Normal appendix. The abdominal aorta and IVC appear patent. No portal venous gas identified. There is no adenopathy. Small fat containing umbilical hernia. The osseous structures appear unremarkable. IMPRESSION: Sigmoid diverticulitis with focally contained microperforation and early abscess formation. An ill-defined tract flu-like structure between the sigmoid colon and adjacent loop of small bowel may represent a fistula. Evaluation of the distal small bowel and sigmoid is limited as this loops of bowel are not filled with oral contrast. Electronically Signed   By: Elgie Collard M.D.   On: 09/08/2015 01:33    EKG:  Orders placed or performed during the hospital encounter of 08/03/15  . ED EKG  . ED EKG  . EKG 12-Lead  . 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 Sepsis due to acute diverticulitis/abscess, continue patient on ciprofloxacin and Flagyl intravenously, following clinically. White blood cell count has been improving 2. Acute diverticulitis versus inflammatory bowel disease exacerbation, continue broad-spectrum antibiotic therapy. Follow clinically gastroenterologist as well as surgery consulted. Advancing pain medications 3. Renal insufficiency, resolved and that with IV fluid administration, likely dehydration related. Urinalysis was unremarkable 4. leukocytosis, improving with antibiotic therapy 5. Hyponatremia, resolved with IV fluid administration  Management plans discussed with the patient, family and they are in agreement.   DRUG ALLERGIES: No Known Allergies  CODE STATUS:      Code Status Orders        Start     Ordered   09/08/15 0330  Full code   Continuous     09/08/15 0329      TOTAL TIME TAKING CARE OF THIS PATIENT: 40 minutes.    Katharina Caper M.D on 09/08/2015 at  4:13 PM  Between 7am to 6pm - Pager - [phone]  After 6pm go to [email or web address] - password [email or web address] [facility]  [facility] [place] [facility]  Office  [phone]  CC: Primary care physician; No PCP Per Patient

## 2015-09-09 ENCOUNTER — Encounter: Payer: Self-pay | Admitting: Surgery

## 2015-09-09 DIAGNOSIS — K5780 Diverticulitis of intestine, part unspecified, with perforation and abscess without bleeding: Secondary | ICD-10-CM

## 2015-09-09 DIAGNOSIS — K5720 Diverticulitis of large intestine with perforation and abscess without bleeding: Secondary | ICD-10-CM

## 2015-09-09 LAB — URINE CULTURE: Culture: NO GROWTH

## 2015-09-09 MED ORDER — NICOTINE 7 MG/24HR TD PT24
7.0000 mg | MEDICATED_PATCH | Freq: Every day | TRANSDERMAL | Status: DC
  Filled 2015-09-09 (×2): qty 1

## 2015-09-09 NOTE — Progress Notes (Signed)
CC: Lower quadrant abscess Subjective: This patient with a right lower quadrant abscess and a suspicion for Crohn's versus diverticular disease. I discussed patient's care with Dr. Marva Panda. We both agree that continued IV antibiotics is warranted and at some point he will require colonoscopy as well as possible laparoscopy at that Route remains for the future. Patient feels better today with no nausea vomiting and less pain.  Objective: Vital signs in last 24 hours: Temp:  [97.6 F (36.4 C)-98.4 F (36.9 C)] 97.8 F (36.6 C) (01/10 0612) Pulse Rate:  [75-80] 80 (01/10 0612) Resp:  [17-18] 18 (01/10 0612) BP: (111-125)/(71-76) 113/71 mmHg (01/10 0612) SpO2:  [98 %-100 %] 99 % (01/10 0612) Last BM Date: 09/05/15  Intake/Output from previous day: 01/09 0701 - 01/10 0700 In: 1399 [P.O.:680; I.V.:419; IV Piggyback:300] Out: 2000 [Urine:2000] Intake/Output this shift:    Physical exam:  Abdomen is soft less tender no peritoneal signs Awake alert oriented Nontender calves  Lab Results: CBC   Recent Labs  09/07/15 2146 09/08/15 0504  WBC 19.7* 15.6*  HGB 15.0 12.8*  HCT 45.2 38.1*  PLT 217 177   BMET  Recent Labs  09/07/15 2146 09/08/15 0504  NA 130* 136  K 3.5 3.7  CL 95* 104  CO2 27 27  GLUCOSE 98 85  BUN 13 11  CREATININE 1.28* 1.02  CALCIUM 8.8* 8.0*   PT/INR No results for input(s): LABPROT, INR in the last 72 hours. ABG No results for input(s): PHART, HCO3 in the last 72 hours.  Invalid input(s): PCO2, PO2  Studies/Results: Ct Abdomen Pelvis W Contrast  09/08/2015  CLINICAL DATA:  35 year old male with lower abdominal pain. EXAM: CT ABDOMEN AND PELVIS WITH CONTRAST TECHNIQUE: Multidetector CT imaging of the abdomen and pelvis was performed using the standard protocol following bolus administration of intravenous contrast. CONTRAST:  OMNIPAQUE IOHEXOL 300 MG/ML  SOLN COMPARISON:  CT dated 08/06/2015 FINDINGS: The visualized lung bases are clear. No  intra-abdominal free air. Trace free fluid may be present within the pelvis. The liver, gallbladder, pancreas, spleen, adrenal glands, kidneys appear unremarkable. There is mild urothelial haziness of the distal ureters and urinary bladder likely reactive to inflammatory changes of the bowel within the pelvis. The prostate and seminal vesicles are grossly unremarkable. There is sigmoid diverticulitis. There is diffuse inflammatory changes of the perisigmoid fat as well as stranding of the pelvic floor fat. Multiple small pockets of extraluminal gas noted within the pelvis likely representing focally contained microperforation is. There is inflammatory changes and thickening of distal ileum within the pelvis, likely secondary to inflammatory changes of the sigmoid colon. A tract like structure containing pockets of air may be present between the sigmoid colon and adjacent distal small bowel concerning for fistulous formation. Evaluation of these loops of bowel is limited as they are not opacified with oral contrast. There is an ill-defined 3.3 x 2.0 cm fluid density with apparent organized wall within the right hemipelvis concerning for developing abscess. Moderate stool noted within the colon. No evidence of bowel obstruction. Normal appendix. The abdominal aorta and IVC appear patent. No portal venous gas identified. There is no adenopathy. Small fat containing umbilical hernia. The osseous structures appear unremarkable. IMPRESSION: Sigmoid diverticulitis with focally contained microperforation and early abscess formation. An ill-defined tract flu-like structure between the sigmoid colon and adjacent loop of small bowel may represent a fistula. Evaluation of the distal small bowel and sigmoid is limited as this loops of bowel are not filled with oral  contrast. Electronically Signed   By: Elgie Collard M.D.   On: 09/08/2015 01:33    Anti-infectives: Anti-infectives    Start     Dose/Rate Route Frequency  Ordered Stop   09/08/15 1000  ciprofloxacin (CIPRO) IVPB 400 mg     400 mg 200 mL/hr over 60 Minutes Intravenous Every 12 hours 09/08/15 0332     09/08/15 0800  metroNIDAZOLE (FLAGYL) IVPB 500 mg     500 mg 100 mL/hr over 60 Minutes Intravenous Every 8 hours 09/08/15 0329     09/07/15 2330  ciprofloxacin (CIPRO) IVPB 400 mg     400 mg 200 mL/hr over 60 Minutes Intravenous  Once 09/07/15 2325 09/08/15 0236   09/07/15 2330  metroNIDAZOLE (FLAGYL) IVPB 500 mg     500 mg 100 mL/hr over 60 Minutes Intravenous  Once 09/07/15 2325 09/08/15 0043      Assessment/Plan:  Small right lower quadrant abscess of unclear etiology possibly Crohn's enteritis versus diverticular disease. Dr. Marva Panda and I have discussed the patient's care etiology and prognosis. Recommend continued IV antibiotics and workup as an outpatient at some point including colonoscopy and possible laparoscopy  Lattie Haw, MD, FACS  09/09/2015

## 2015-09-09 NOTE — Progress Notes (Signed)
[facility]   PATIENT NAME: Adam Pugh    MR#:  [id number]  DATE OF BIRTH:  04-Jul-1981  SUBJECTIVE: Have abdominal pain CHIEF COMPLAINT:   Chief Complaint  Patient presents with  . Abdominal Pain   Patient is a 35 year old African-American male with past medical history significant for history of suspected inflammatory bowel disease who came into the hospital with increasing abdominal pain. CT scan of his abdomen that revealed sigmoid diverticulitis with microperforation and early abscess formation. Patient's labs showed renal insufficiency, hyponatremia, leukocytosis. Patient was initiated on IV ciprofloxacin and Flagyl and was admitted to the hospital for further evaluation and therapy. Patient is doing better today. Denies any significant pain and had no bowel movements yet, would like to have his diet advanced to full liquids.   Review of Systems  Constitutional: Negative for fever, chills and weight loss.  HENT: Negative for congestion.   Eyes: Negative for blurred vision and double vision.  Respiratory: Negative for cough, sputum production, shortness of breath and wheezing.   Cardiovascular: Negative for chest pain, palpitations, orthopnea, leg swelling and PND.  Gastrointestinal: Positive for abdominal pain and constipation. Negative for nausea, vomiting, diarrhea and blood in stool.  Genitourinary: Negative for dysuria, urgency, frequency and hematuria.  Musculoskeletal: Negative for falls.  Neurological: Negative for dizziness, tremors, focal weakness and headaches.  Endo/Heme/Allergies: Does not bruise/bleed easily.  Psychiatric/Behavioral: Negative for depression. The patient does not have insomnia.     VITAL SIGNS: Blood pressure 123/67, pulse 71, temperature 97.6 F (36.4 C), temperature source Oral, resp. rate 18, height 6\' 2"  (1.88 m), weight 97.569 kg (215 lb 1.6 oz), SpO2 99 %.  PHYSICAL EXAMINATION:   GENERAL:   35 y.o.-year-old patient lying in the bed in no distress today EYES: Pupils equal, round, reactive to light and accommodation. No scleral icterus. Extraocular muscles intact.  HEENT: Head atraumatic, normocephalic. Oropharynx and nasopharynx clear.  NECK:  Supple, no jugular venous distention. No thyroid enlargement, no tenderness.  LUNGS: Normal breath sounds bilaterally, no wheezing, rales,rhonchi or crepitation. No use of accessory muscles of respiration.  CARDIOVASCULAR: S1, S2 normal. No murmurs, rubs, or gallops.  ABDOMEN: Soft, minimal discomfort on palpation mostly in left lower quadrant and suprapubically , no rebound or guarding were noted, nondistended. Bowel sounds are better. No organomegaly or mass.  EXTREMITIES: No pedal edema, cyanosis, or clubbing.  NEUROLOGIC: Cranial nerves II through XII are intact. Muscle strength 5/5 in all extremities. Sensation intact. Gait not checked.  PSYCHIATRIC: The patient is alert and oriented x 3.  SKIN: No obvious rash, lesion, or ulcer.   ORDERS/RESULTS REVIEWED:   CBC  Recent Labs Lab 09/07/15 2146 09/08/15 0504  WBC 19.7* 15.6*  HGB 15.0 12.8*  HCT 45.2 38.1*  PLT 217 177  MCV 85.5 85.7  MCH 28.4 28.7  MCHC 33.2 33.5  RDW 14.3 14.6*   ------------------------------------------------------------------------------------------------------------------  Chemistries   Recent Labs Lab 09/07/15 2146 09/08/15 0504  NA 130* 136  K 3.5 3.7  CL 95* 104  CO2 27 27  GLUCOSE 98 85  BUN 13 11  CREATININE 1.28* 1.02  CALCIUM 8.8* 8.0*  AST 12*  --   ALT 14*  --   ALKPHOS 46  --   BILITOT 1.1  --    ------------------------------------------------------------------------------------------------------------------ estimated creatinine clearance is 120.9 mL/min (by C-G formula based on Cr of 1.02). ------------------------------------------------------------------------------------------------------------------ No results for  input(s): TSH, T4TOTAL, T3FREE, THYROIDAB in the last 72 hours.  Invalid input(s): FREET3  Cardiac Enzymes No results for input(s): CKMB, TROPONINI, MYOGLOBIN in the last 168 hours.  Invalid input(s): CK ------------------------------------------------------------------------------------------------------------------ Invalid input(s): POCBNP ---------------------------------------------------------------------------------------------------------------  RADIOLOGY: Ct Abdomen Pelvis W Contrast  09/08/2015  CLINICAL DATA:  35 year old male with lower abdominal pain. EXAM: CT ABDOMEN AND PELVIS WITH CONTRAST TECHNIQUE: Multidetector CT imaging of the abdomen and pelvis was performed using the standard protocol following bolus administration of intravenous contrast. CONTRAST:  OMNIPAQUE IOHEXOL 300 MG/ML  SOLN COMPARISON:  CT dated 08/06/2015 FINDINGS: The visualized lung bases are clear. No intra-abdominal free air. Trace free fluid may be present within the pelvis. The liver, gallbladder, pancreas, spleen, adrenal glands, kidneys appear unremarkable. There is mild urothelial haziness of the distal ureters and urinary bladder likely reactive to inflammatory changes of the bowel within the pelvis. The prostate and seminal vesicles are grossly unremarkable. There is sigmoid diverticulitis. There is diffuse inflammatory changes of the perisigmoid fat as well as stranding of the pelvic floor fat. Multiple small pockets of extraluminal gas noted within the pelvis likely representing focally contained microperforation is. There is inflammatory changes and thickening of distal ileum within the pelvis, likely secondary to inflammatory changes of the sigmoid colon. A tract like structure containing pockets of air may be present between the sigmoid colon and adjacent distal small bowel concerning for fistulous formation. Evaluation of these loops of bowel is limited as they are not opacified with oral contrast.  There is an ill-defined 3.3 x 2.0 cm fluid density with apparent organized wall within the right hemipelvis concerning for developing abscess. Moderate stool noted within the colon. No evidence of bowel obstruction. Normal appendix. The abdominal aorta and IVC appear patent. No portal venous gas identified. There is no adenopathy. Small fat containing umbilical hernia. The osseous structures appear unremarkable. IMPRESSION: Sigmoid diverticulitis with focally contained microperforation and early abscess formation. An ill-defined tract flu-like structure between the sigmoid colon and adjacent loop of small bowel may represent a fistula. Evaluation of the distal small bowel and sigmoid is limited as this loops of bowel are not filled with oral contrast. Electronically Signed   By: Elgie Collard M.D.   On: 09/08/2015 01:33    EKG:  Orders placed or performed during the hospital encounter of 08/03/15  . ED EKG  . ED EKG  . EKG 12-Lead  . 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 Sepsis due to acute diverticulitis? with likely abscess, continue patient on ciprofloxacin and Flagyl intravenously, improved clinically. White blood cell count has been improving. Appreciate gastroenterology as well as surgery input. Patient will need a colonoscopy, as well as possibly laparoscopy. 2. Acute diverticulitis versus inflammatory bowel disease exacerbation, continue broad-spectrum antibiotic therapy. Improving clinically, appreciate gastroenterologist as well as surgery inputs, advancing diet to full liquids. Comfortable with current pain medications 3. Renal insufficiency, resolved with IV fluid administration, likely dehydration related.  Urinalysis was unremarkable  4. leukocytosis, improving with antibiotic therapy, following the morning 5. Tobacco abuse, discussed this patient for approximately 4 minutes. Nicotine replacement therapy is initiated 5. Hyponatremia, resolved with IV fluid administration  Management plans discussed with the patient, family and they are in agreement.   DRUG ALLERGIES: No Known Allergies  CODE STATUS:  Code Status Orders        Start     Ordered   09/08/15 0330  Full code   Continuous     09/08/15 0329      TOTAL TIME TAKING CARE OF THIS PATIENT: 35 minutes.    Katharina Caper M.D on 09/09/2015 at 2:04 PM  Between 7am to 6pm - Pager - [phone]  After 6pm go to [email or web address] - password [email or web address] [facility]  [facility]  Office  [phone]  CC: Primary care physician; No PCP Per Patient

## 2015-09-09 NOTE — Consult Note (Signed)
Subjective: Patient seen for abdominal pain and abnormal CT. Patient tolerating full liquid diet today.  No nausea, no bm, abdominal pain is 6/10, states he is feeling some better.   Objective: Vital signs in last 24 hours: Temp:  [97.6 F (36.4 C)-98.4 F (36.9 C)] 97.6 F (36.4 C) (01/10 1316) Pulse Rate:  [71-80] 71 (01/10 1316) Resp:  [18] 18 (01/10 1316) BP: (111-123)/(67-76) 123/67 mmHg (01/10 1316) SpO2:  [99 %-100 %] 99 % (01/10 1316) Blood pressure 123/67, pulse 71, temperature 97.6 F (36.4 C), temperature source Oral, resp. rate 18, height 6\' 2"  (1.88 m), weight 97.569 kg (215 lb 1.6 oz), SpO2 99 %.   Intake/Output from previous day: 01/09 0701 - 01/10 0700 In: 1399 [P.O.:680; I.V.:419; IV Piggyback:300] Out: 2000 [Urine:2000]  Intake/Output this shift: Total I/O In: 720 [P.O.:720] Out: 230 [Urine:200; Drains:30]   General appearance:  35 male no distress.  Resp:  BCTA Cardio:  rrr without rmg GI:  Soft positive tender to palpation in the lower abdomen, mostly suprapubic and llq.  No rebound, positive bowel sounds.  Extremities:  No cce   Lab Results: No results found for this or any previous visit (from the past 24 hour(s)).    Recent Labs  09/07/15 2146 09/08/15 0504  WBC 19.7* 15.6*  HGB 15.0 12.8*  HCT 45.2 38.1*  PLT 217 177   BMET  Recent Labs  09/07/15 2146 09/08/15 0504  NA 130* 136  K 3.5 3.7  CL 95* 104  CO2 27 27  GLUCOSE 98 85  BUN 13 11  CREATININE 1.28* 1.02  CALCIUM 8.8* 8.0*   LFT  Recent Labs  09/07/15 2146  PROT 7.8  ALBUMIN 3.9  AST 12*  ALT 14*  ALKPHOS 46  BILITOT 1.1   PT/INR No results for input(s): LABPROT, INR in the last 72 hours. Hepatitis Panel No results for input(s): HEPBSAG, HCVAB, HEPAIGM, HEPBIGM in the last 72 hours. C-Diff No results for input(s): CDIFFTOX in the last 72 hours. No results for input(s): CDIFFPCR in the last 72 hours.   Studies/Results: Ct Abdomen Pelvis W  Contrast  09/08/2015  CLINICAL DATA:  35 year old male with lower abdominal pain. EXAM: CT ABDOMEN AND PELVIS WITH CONTRAST TECHNIQUE: Multidetector CT imaging of the abdomen and pelvis was performed using the standard protocol following bolus administration of intravenous contrast. CONTRAST:  OMNIPAQUE IOHEXOL 300 MG/ML  SOLN COMPARISON:  CT dated 08/06/2015 FINDINGS: The visualized lung bases are clear. No intra-abdominal free air. Trace free fluid may be present within the pelvis. The liver, gallbladder, pancreas, spleen, adrenal glands, kidneys appear unremarkable. There is mild urothelial haziness of the distal ureters and urinary bladder likely reactive to inflammatory changes of the bowel within the pelvis. The prostate and seminal vesicles are grossly unremarkable. There is sigmoid diverticulitis. There is diffuse inflammatory changes of the perisigmoid fat as well as stranding of the pelvic floor fat. Multiple small pockets of extraluminal gas noted within the pelvis likely representing focally contained microperforation is. There is inflammatory changes and thickening of distal ileum within the pelvis, likely secondary to inflammatory changes of the sigmoid colon. A tract like structure containing pockets of air may be present between the sigmoid colon and adjacent distal small bowel concerning for fistulous formation. Evaluation of these loops of bowel is limited as they are not opacified with oral contrast. There is an ill-defined 3.3 x 2.0 cm fluid density with apparent organized wall within the right hemipelvis concerning for developing abscess. Moderate  stool noted within the colon. No evidence of bowel obstruction. Normal appendix. The abdominal aorta and IVC appear patent. No portal venous gas identified. There is no adenopathy. Small fat containing umbilical hernia. The osseous structures appear unremarkable. IMPRESSION: Sigmoid diverticulitis with focally contained microperforation and early  abscess formation. An ill-defined tract flu-like structure between the sigmoid colon and adjacent loop of small bowel may represent a fistula. Evaluation of the distal small bowel and sigmoid is limited as this loops of bowel are not filled with oral contrast. Electronically Signed   By: Elgie Collard M.D.   On: 09/08/2015 01:33    Scheduled Inpatient Medications:   . ciprofloxacin  400 mg Intravenous Q12H  . docusate sodium  100 mg Oral BID  . enoxaparin (LOVENOX) injection  40 mg Subcutaneous Q0600  . metronidazole  500 mg Intravenous Q8H  . nicotine  7 mg Transdermal Daily  . pantoprazole  40 mg Oral Daily  . sodium chloride  3 mL Intravenous Q12H    Continuous Inpatient Infusions:     PRN Inpatient Medications:  acetaminophen **OR** acetaminophen, HYDROcodone-acetaminophen, HYDROmorphone (DILAUDID) injection, ondansetron **OR** ondansetron (ZOFRAN) IV  Miscellaneous:   Assessment:  1) abdominal pain, abnormal CT of the abdomen, DDX Crohns dz versus diverticulitis.  Improvement on current abx.   Plan:  1) continue current.  Luminal evaluation when clinically feasible as o/p after completion of treatment.  Possible need for ex-lap.  I appreciate Dr Randolm Idol  input.   [name] MD 09/09/2015, 6:43 PM

## 2015-09-09 NOTE — Progress Notes (Signed)
ANTIBIOTIC CONSULT NOTE - Follow Up  Pharmacy Consult for ciprofloxacin Indication: IAI  No Known Allergies  Patient Measurements: Height: 6\' 2"  (188 cm) Weight: 215 lb 1.6 oz (97.569 kg) IBW/kg (Calculated) : 82.2  Vital Signs: Temp: 97.8 F (36.6 C) (01/10 0612) Temp Source: Oral (01/10 0612) BP: 113/71 mmHg (01/10 0612) Pulse Rate: 80 (01/10 0612) Intake/Output from previous day: 01/09 0701 - 01/10 0700 In: 1399 [P.O.:680; I.V.:419; IV Piggyback:300] Out: 2000 [Urine:2000] Intake/Output from this shift:    Labs:  Recent Labs  09/07/15 2146 09/08/15 0504  WBC 19.7* 15.6*  HGB 15.0 12.8*  PLT 217 177  CREATININE 1.28* 1.02   Estimated Creatinine Clearance: 120.9 mL/min (by C-G formula based on Cr of 1.02).   Microbiology: Recent Results (from the past 720 hour(s))  Urine culture     Status: None (Preliminary result)   Collection Time: 09/08/15  3:30 AM  Result Value Ref Range Status   Specimen Description URINE, RANDOM  Final   Special Requests NONE  Final   Culture NO GROWTH < 12 HOURS  Final   Report Status PENDING  Incomplete    Medical History: Past Medical History  Diagnosis Date  . Inflammatory bowel disease (Crohn's disease) (HCC)     Medications:  Infusions:    Assessment: 34 yom cc abdominal pain/fever/chills. Has been on Cipro/Flagyl outpatient (current prescriptions per surgery notes). CT suggests Crohn's disease or IBD with fistula formation and now abscess as opposed to diverticular abscess. No urgent surgical need, pharmacy consulted to dose ciprofloxacin.  WBC 19.7-->15.6  Plan:  Continue ciprofloxacin 400 mg IV Q12H for recommended duration of 7 days.  Pharmacy will continue to monitor for changes in renal function requiring dose change.   Roque Cash, PharmD Pharmacy Resident 09/09/2015

## 2015-09-10 DIAGNOSIS — Z720 Tobacco use: Secondary | ICD-10-CM

## 2015-09-10 DIAGNOSIS — N289 Disorder of kidney and ureter, unspecified: Secondary | ICD-10-CM

## 2015-09-10 LAB — CBC
HCT: 37.4 % — ABNORMAL LOW (ref 40.0–52.0)
Hemoglobin: 12.5 g/dL — ABNORMAL LOW (ref 13.0–18.0)
MCH: 28.9 pg (ref 26.0–34.0)
MCHC: 33.4 g/dL (ref 32.0–36.0)
MCV: 86.4 fL (ref 80.0–100.0)
PLATELETS: 214 10*3/uL (ref 150–440)
RBC: 4.33 MIL/uL — ABNORMAL LOW (ref 4.40–5.90)
RDW: 13.8 % (ref 11.5–14.5)
WBC: 8.9 10*3/uL (ref 3.8–10.6)

## 2015-09-10 MED ORDER — NICOTINE 7 MG/24HR TD PT24: 7.0000 mg | MEDICATED_PATCH | Freq: Every day | TRANSDERMAL | Status: DC

## 2015-09-10 MED ORDER — OXYCODONE-ACETAMINOPHEN 5-325 MG PO TABS: 1.0000 | ORAL_TABLET | Freq: Four times a day (QID) | ORAL | Status: DC | PRN

## 2015-09-10 MED ORDER — PANTOPRAZOLE SODIUM 40 MG PO TBEC: 40.0000 mg | DELAYED_RELEASE_TABLET | Freq: Every day | ORAL | Status: DC

## 2015-09-10 MED ORDER — METRONIDAZOLE 500 MG PO TABS: 500.0000 mg | ORAL_TABLET | Freq: Three times a day (TID) | ORAL | Status: DC

## 2015-09-10 MED ORDER — CIPROFLOXACIN HCL 500 MG PO TABS: 500.0000 mg | ORAL_TABLET | Freq: Two times a day (BID) | ORAL | Status: DC

## 2015-09-10 NOTE — Progress Notes (Signed)
MD ordered patient to be discharged home.  Discharge instructions were reviewed with the patient and he voiced understanding.  Prescription was given to the patient.  Follow-up appointments were made.  IV was removed with catheter intact.  All patients questions were answered.  Patient waiting for his ride.

## 2015-09-10 NOTE — Discharge Summary (Signed)
[facility] - [place] at [facility]   PATIENT NAME: Adam Pugh    MR#:  [id number]  DATE OF BIRTH:  01-04-1981  DATE OF ADMISSION:  09/07/2015 ADMITTING PHYSICIAN: Oralia Manis, MD  DATE OF DISCHARGE: No discharge date for patient encounter.  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 Inflammatory bowel disease (Crohn's disease) (HCC)   . Diverticulitis of large intestine with perforation and abscess without bleeding     .pro HOSPITAL COURSE:   The patient is a 35 year old African-American male with past medical history significant for history of suspected inflammatory bowel disease/ Crohn's disease presents back to the hospital with increasing abdominal pain, also fever and chills. He had decreased energy level, nausea, decreased appetite and darker urine. A patient first started having abdominal pain in September and he's presentation was concerning for diverticulitis versus inflammatory bowel disease. He was asked to follow up with gastroenterologist and surgeon but never did. Now the patient is back with fevers and chills and abdominal pain. On arrival to emergency room, patient's white blood cell count was found to be elevated at 19.7 thousand, he was found to have mild renal insufficiency with creatinine of 1.28, sodium level 130. CT of abdomen showed sigmoid diverticulitis with focally contained microperforation and early abscess formation. An  ill-defined tract was also noted which was concerning for fistula. Patient was admitted to the hospital and initiated on ciprofloxacin and Flagyl intravenously. Consultations with gastroenterologist as well as surgeon were obtained. Patient was treated conservatively and improved. His diet was advanced to full liquid diet. Patient's white blood cell count normalized. He remained afebrile. It was felt that patient is stable to be discharged home on full liquid diet and follow-up with gastroenterology and surgery as outpatient for repeated CT scan and further gastroenterologic workup. Discussion by problem 1. Sepsis due to acute diverticulitis? with likely abscess versus inflammatory bowel disease with fistula, continue patient on ciprofloxacin and Flagyl orally, improved clinically. White blood cell count has normalized . Patient is to follow-up with gastroenterology as well as surgery as outpatient. Patient will need a colonoscopy, as well as possibly laparoscopy as outpatient. Discussed with Dr. Marva Panda  2. Acute diverticulitis versus inflammatory bowel disease exacerbation, continue broad-spectrum antibiotic therapy. Improved clinically, appreciate gastroenterologist as well as surgery inputs, continue full liquid diet at home for the next 3-4 days and advance to soft diet as tolerated over the next week , continue Percocet for pain control  3. Renal insufficiency, resolved with IV fluid administration, likely dehydration related. Urinalysis was unremarkable  4. leukocytosis, resolved with antibiotic therapy 5. Tobacco abuse, discussed this patient for approximately 4 minutes yesterday. Nicotine replacement therapy to be continued 5. Hyponatremia, resolved with IV fluid administration  DISCHARGE CONDITIONS:   Stable  CONSULTS OBTAINED:  Treatment Team:  Gladis Riffle, MD [name], MD  DRUG ALLERGIES:  No Known Allergies  DISCHARGE MEDICATIONS:   Current Discharge Medication  List    START taking these medications  Details  ciprofloxacin (CIPRO) 500 MG tablet Take 1 tablet (500 mg total) by mouth 2 (two) times daily. Qty: 28 tablet, Refills: 0    metroNIDAZOLE (FLAGYL) 500 MG tablet Take 1 tablet (500 mg total) by mouth 3 (three) times daily. Qty: 42 tablet, Refills: 0    nicotine (NICODERM CQ - DOSED IN MG/24 HR) 7 mg/24hr patch Place 1 patch (7 mg total) onto the skin daily. Qty: 28 patch, Refills: 0    pantoprazole (PROTONIX) 40 MG tablet Take 1 tablet (40 mg total) by mouth daily. Qty: 30 tablet, Refills: 6      CONTINUE these medications which have CHANGED   Details  oxyCODONE-acetaminophen (PERCOCET/ROXICET) 5-325 MG tablet Take 1 tablet by mouth every 6 (six) hours as needed for moderate pain. Qty: 40 tablet, Refills: 0         DISCHARGE INSTRUCTIONS:    Patient is to follow-up with primary care physician, gastroenterologist, surgeon as outpatient  If you experience worsening of your admission symptoms, develop shortness of breath, life threatening emergency, suicidal or homicidal thoughts you must seek medical attention immediately by calling 911 or calling your MD immediately  if symptoms less severe.  You Must read complete instructions/literature along with all the possible adverse reactions/side effects for all the Medicines you take and that have been prescribed to you. Take any new Medicines after you have completely understood and accept all the possible adverse reactions/side effects.   Please note  You were cared for by a hospitalist during your hospital stay. If you have any questions about your discharge medications or the care you received while you were in the hospital after you are discharged, you can call the unit and asked to speak with the hospitalist on call if the hospitalist that took care of you is not available. Once you are discharged, your primary care physician will handle any further medical issues. Please note that  NO REFILLS for any discharge medications will be authorized once you are discharged, as it is imperative that you return to your primary care physician (or establish a relationship with a primary care physician if you do not have one) for your aftercare needs so that they can reassess your need for medications and monitor your lab values.    Today   CHIEF COMPLAINT:   Chief Complaint  Patient presents with  . Abdominal Pain    HISTORY OF PRESENT ILLNESS:  Adam Pugh  is a 35 y.o. male with a known history of history of suspected inflammatory bowel disease/ Crohn's disease presents back to the hospital with increasing abdominal pain, also fever and chills. He had decreased energy level, nausea, decreased appetite and darker urine. A patient first started having abdominal pain in September and he's presentation was concerning for diverticulitis versus inflammatory bowel disease. He was asked to follow up with gastroenterologist and surgeon but never did. Now the patient is back with fevers and chills and abdominal pain. On arrival to emergency room, patient's white blood cell count was found to be elevated at 19.7 thousand, he was found to have mild renal insufficiency with creatinine of 1.28, sodium level 130. CT of abdomen showed sigmoid diverticulitis with focally contained microperforation and early abscess formation. An ill-defined tract was also noted which was concerning for fistula. Patient was admitted to the hospital and initiated on ciprofloxacin and Flagyl intravenously. Consultations with gastroenterologist as well as surgeon were obtained. Patient was treated conservatively and improved. His diet was advanced to full liquid diet.  Patient's white blood cell count normalized. He remained afebrile. It was felt that patient is stable to be discharged home on full liquid diet and follow-up with gastroenterology and surgery as outpatient for repeated CT scan and further gastroenterologic  workup. Discussion by problem 1. Sepsis due to acute diverticulitis? with likely abscess versus inflammatory bowel disease with fistula, continue patient on ciprofloxacin and Flagyl orally, improved clinically. White blood cell count has normalized . Patient is to follow-up with gastroenterology as well as surgery as outpatient. Patient will need a colonoscopy, as well as possibly laparoscopy as outpatient. Discussed with Dr. Marva Panda  2. Acute diverticulitis versus inflammatory bowel disease exacerbation, continue broad-spectrum antibiotic therapy. Improved clinically, appreciate gastroenterologist as well as surgery inputs, continue full liquid diet at home for the next 3-4 days and advance to soft diet as tolerated over the next week , continue Percocet for pain control  3. Renal insufficiency, resolved with IV fluid administration, likely dehydration related. Urinalysis was unremarkable  4. leukocytosis, resolved with antibiotic therapy 5. Tobacco abuse, discussed this patient for approximately 4 minutes yesterday. Nicotine replacement therapy to be continued 5. Hyponatremia, resolved with IV fluid administration     VITAL SIGNS:  Blood pressure 123/78, pulse 64, temperature 97.5 F (36.4 C), temperature source Oral, resp. rate 17, height 6\' 2"  (1.88 m), weight 97.569 kg (215 lb 1.6 oz), SpO2 100 %.  I/O:   Intake/Output Summary (Last 24 hours) at 09/10/15 1541 Last data filed at 09/10/15 1300  Gross per 24 hour  Intake   2149 ml  Output   2701 ml  Net   -552 ml    PHYSICAL EXAMINATION:  GENERAL:  35 y.o.-year-old patient lying in the bed with no acute distress.  EYES: Pupils equal, round, reactive to light and accommodation. No scleral icterus. Extraocular muscles intact.  HEENT: Head atraumatic, normocephalic. Oropharynx and nasopharynx clear.  NECK:  Supple, no jugular venous distention. No thyroid enlargement, no tenderness.  LUNGS: Normal breath sounds bilaterally, no  wheezing, rales,rhonchi or crepitation. No use of accessory muscles of respiration.  CARDIOVASCULAR: S1, S2 normal. No murmurs, rubs, or gallops.  ABDOMEN: Soft, mild discomfort in the left lower quadrant on palpation but no rebound or guarding noted, non-distended. Bowel sounds present. No organomegaly or mass.  EXTREMITIES: No pedal edema, cyanosis, or clubbing.  NEUROLOGIC: Cranial nerves II through XII are intact. Muscle strength 5/5 in all extremities. Sensation intact. Gait not checked.  PSYCHIATRIC: The patient is alert and oriented x 3.  SKIN: No obvious rash, lesion, or ulcer.   DATA REVIEW:   CBC  Recent Labs Lab 09/10/15 0553  WBC 8.9  HGB 12.5*  HCT 37.4*  PLT 214    Chemistries   Recent Labs Lab 09/07/15 2146 09/08/15 0504  NA 130* 136  K 3.5 3.7  CL 95* 104  CO2 27 27  GLUCOSE 98 85  BUN 13 11  CREATININE 1.28* 1.02  CALCIUM 8.8* 8.0*  AST 12*  --   ALT 14*  --   ALKPHOS 46  --   BILITOT 1.1  --     Cardiac Enzymes No results for input(s): TROPONINI in the last 168 hours.  Microbiology Results  Results for orders placed or performed during the hospital encounter of 09/07/15  Urine culture     Status: None   Collection Time: 09/08/15  3:30 AM  Result Value Ref Range Status   Specimen Description URINE, RANDOM  Final   Special Requests NONE  Final  Culture NO GROWTH 1 DAY  Final   Report Status 09/09/2015 FINAL  Final    RADIOLOGY:  No results found.  EKG:   Orders placed or performed during the hospital encounter of 08/03/15  . ED EKG  . ED EKG  . EKG 12-Lead  . EKG 12-Lead      Management plans discussed with the patient, family and they are in agreement.  CODE STATUS:     Code Status Orders        Start     Ordered   09/08/15 0330  Full code   Continuous     09/08/15 0329    Code Status History    Date Active Date Inactive Code Status Order ID Comments User Context   08/03/2015  1:55 PM 08/08/2015  5:22 PM Full Code  [id number]  Houston Siren, MD Inpatient      TOTAL TIME TAKING CARE OF THIS PATIENT: 40 minutes.    Katharina Caper M.D on 09/10/2015 at 3:41 PM  Between 7am to 6pm - Pager - [phone]  After 6pm go to [email or web address] - password [email or web address] [facility]  [facility]  Office  [phone]  CC: Primary care physician; No PCP Per Patient

## 2015-09-10 NOTE — Progress Notes (Signed)
CC: Lower abdominal pain Subjective: Patient feels better today has no complaints at this time and is feeling much better than he has. He states that he is to be discharged tomorrow. He has no fevers or chills no nausea or vomiting  Objective: Vital signs in last 24 hours: Temp:  [97.6 F (36.4 C)-98.5 F (36.9 C)] 98.2 F (36.8 C) (01/11 0605) Pulse Rate:  [67-88] 67 (01/11 0605) Resp:  [17-18] 17 (01/11 0605) BP: (123-148)/(67-87) 136/87 mmHg (01/11 0605) SpO2:  [99 %-100 %] 100 % (01/11 0605) Last BM Date: 09/09/15  Intake/Output from previous day: 01/10 0701 - 01/11 0700 In: 2103 [P.O.:1900; I.V.:3; IV Piggyback:200] Out: 2651 [Urine:2650; Stool:1] Intake/Output this shift: Total I/O In: 480 [P.O.:480] Out: 0   Physical exam:  Abdomen is soft and only minimally tender in the suprapubic area. A peritoneal signs. Calves are nontender.  Lab Results: CBC   Recent Labs  09/08/15 0504 09/10/15 0553  WBC 15.6* 8.9  HGB 12.8* 12.5*  HCT 38.1* 37.4*  PLT 177 214   BMET  Recent Labs  09/07/15 2146 09/08/15 0504  NA 130* 136  K 3.5 3.7  CL 95* 104  CO2 27 27  GLUCOSE 98 85  BUN 13 11  CREATININE 1.28* 1.02  CALCIUM 8.8* 8.0*   PT/INR No results for input(s): LABPROT, INR in the last 72 hours. ABG No results for input(s): PHART, HCO3 in the last 72 hours.  Invalid input(s): PCO2, PO2  Studies/Results: No results found.  Anti-infectives: Anti-infectives    Start     Dose/Rate Route Frequency Ordered Stop   09/08/15 1000  ciprofloxacin (CIPRO) IVPB 400 mg     400 mg 200 mL/hr over 60 Minutes Intravenous Every 12 hours 09/08/15 0332     09/08/15 0800  metroNIDAZOLE (FLAGYL) IVPB 500 mg     500 mg 100 mL/hr over 60 Minutes Intravenous Every 8 hours 09/08/15 0329     09/07/15 2330  ciprofloxacin (CIPRO) IVPB 400 mg     400 mg 200 mL/hr over 60 Minutes Intravenous  Once 09/07/15 2325 09/08/15 0236   09/07/15 2330  metroNIDAZOLE (FLAGYL) IVPB 500 mg     500 mg 100 mL/hr over 60 Minutes Intravenous  Once 09/07/15 2325 09/08/15 0043      Assessment/Plan:  Patient doing very well I believe the patient likely has Crohn's disease although could be diverticulitis and he will require outpatient workup he has an appointment to see Dr. Servando Snare next week. I have encouraged him to keep that appointment this time and a follow-up in his office for further workup.  Lattie Haw, MD, FACS  09/10/2015

## 2015-09-10 NOTE — Discharge Instructions (Signed)
Diverticulitis  Diverticulitis is when small pockets that have formed in your colon (large intestine) become infected or swollen.  HOME CARE  · Follow your doctor's instructions.  · Follow a special diet if told by your doctor.  · When you feel better, your doctor may tell you to change your diet. You may be told to eat a lot of fiber. Fruits and vegetables are good sources of fiber. Fiber makes it easier to poop (have bowel movements).  · Take supplements or probiotics as told by your doctor.  · Only take medicines as told by your doctor.  · Keep all follow-up visits with your doctor.  GET HELP IF:  · Your pain does not get better.  · You have a hard time eating food.  · You are not pooping like normal.  GET HELP RIGHT AWAY IF:  · Your pain gets worse.  · Your problems do not get better.  · Your problems suddenly get worse.  · You have a fever.  · You keep throwing up (vomiting).  · You have bloody or black, tarry poop (stool).  MAKE SURE YOU:   · Understand these instructions.  · Will watch your condition.  · Will get help right away if you are not doing well or get worse.     This information is not intended to replace advice given to you by your health care provider. Make sure you discuss any questions you have with your health care provider.     Document Released: 02/02/2008 Document Revised: 08/21/2013 Document Reviewed: 07/11/2013  Elsevier Interactive Patient Education ©2016 [employer]

## 2015-09-15 ENCOUNTER — Ambulatory Visit (INDEPENDENT_AMBULATORY_CARE_PROVIDER_SITE_OTHER): Payer: Medicaid Other | Admitting: Gastroenterology

## 2015-09-15 ENCOUNTER — Encounter: Payer: Self-pay | Admitting: Gastroenterology

## 2015-09-15 ENCOUNTER — Encounter (INDEPENDENT_AMBULATORY_CARE_PROVIDER_SITE_OTHER): Payer: Self-pay

## 2015-09-15 VITALS — BP 125/88 | HR 76 | Temp 98.7°F | Ht 74.0 in | Wt 212.0 lb

## 2015-09-15 DIAGNOSIS — K5732 Diverticulitis of large intestine without perforation or abscess without bleeding: Secondary | ICD-10-CM | POA: Diagnosis not present

## 2015-09-15 MED ORDER — OXYCODONE-ACETAMINOPHEN 5-325 MG PO TABS: 1.0000 | ORAL_TABLET | Freq: Four times a day (QID) | ORAL | Status: DC | PRN

## 2015-09-15 NOTE — Progress Notes (Signed)
Primary Care Physician: No PCP Per Patient  Primary Gastroenterologist:  Dr. Midge Minium  Chief Complaint  Patient presents with  . Hospitalization Follow-up    Crohn's disease    HPI: Adam Pugh is a 35 y.o. male here for follow-up after having been in the hospital for diverticulitis with a perforation. The patient was then discharged on antibiotics and came back again with diverticulitis with an abscess. The patient was thought to possibly have Crohn's disease but on the second hospitalization it was more likely that the patient had recurrent diverticulitis. The patient states he is feeling well now on antibiotics but is concerned about future attacks.  Current Outpatient Prescriptions  Medication Sig Dispense Refill  . ciprofloxacin (CIPRO) 500 MG tablet Take 1 tablet (500 mg total) by mouth 2 (two) times daily. 28 tablet 0  . metroNIDAZOLE (FLAGYL) 500 MG tablet Take 1 tablet (500 mg total) by mouth 3 (three) times daily. 42 tablet 0  . oxyCODONE-acetaminophen (PERCOCET/ROXICET) 5-325 MG tablet Take 1 tablet by mouth every 6 (six) hours as needed for moderate pain. 20 tablet 0  . pantoprazole (PROTONIX) 40 MG tablet Take 1 tablet (40 mg total) by mouth daily. 30 tablet 6  . nicotine (NICODERM CQ - DOSED IN MG/24 HR) 7 mg/24hr patch Place 1 patch (7 mg total) onto the skin daily. (Patient not taking: Reported on 09/15/2015) 28 patch 0   No current facility-administered medications for this visit.    Allergies as of 09/15/2015  . (No Known Allergies)    ROS:  General: Negative for anorexia, weight loss, fever, chills, fatigue, weakness. ENT: Negative for hoarseness, difficulty swallowing , nasal congestion. CV: Negative for chest pain, angina, palpitations, dyspnea on exertion, peripheral edema.  Respiratory: Negative for dyspnea at rest, dyspnea on exertion, cough, sputum, wheezing.  GI: See history of present illness. GU:  Negative for dysuria, hematuria, urinary  incontinence, urinary frequency, nocturnal urination.  Endo: Negative for unusual weight change.    Physical Examination:   BP 125/88 mmHg  Pulse 76  Temp(Src) 98.7 F (37.1 C) (Oral)  Ht  (1.88 m)  Wt 212 lb (96.163 kg)  BMI 27.21 kg/m2  General: Well-nourished, well-developed in no acute distress.  Eyes: No icterus. Conjunctivae pink. Mouth: Oropharyngeal mucosa moist and pink , no lesions erythema or exudate. Lungs: Clear to auscultation bilaterally. Non-labored. Heart: Regular rate and rhythm, no murmurs rubs or gallops.  Abdomen: Bowel sounds are normal, nontender, nondistended, no hepatosplenomegaly or masses, no abdominal bruits or hernia , no rebound or guarding.   Rectal:  external hemorrhoid that is tender to palpation  Extremities: No lower extremity edema. No clubbing or deformities. Neuro: Alert and oriented x 3.  Grossly intact. Skin: Warm and dry, no jaundice.   Psych: Alert and cooperative, normal mood and affect.  Labs:    Imaging Studies: Ct Abdomen Pelvis W Contrast  09/08/2015  CLINICAL DATA:  35 year old male with lower abdominal pain. EXAM: CT ABDOMEN AND PELVIS WITH CONTRAST TECHNIQUE: Multidetector CT imaging of the abdomen and pelvis was performed using the standard protocol following bolus administration of intravenous contrast. CONTRAST:  OMNIPAQUE IOHEXOL 300 MG/ML  SOLN COMPARISON:  CT dated 08/06/2015 FINDINGS: The visualized lung bases are clear. No intra-abdominal free air. Trace free fluid may be present within the pelvis. The liver, gallbladder, pancreas, spleen, adrenal glands, kidneys appear unremarkable. There is mild urothelial haziness of the distal ureters and urinary bladder likely reactive to inflammatory changes of the bowel  within the pelvis. The prostate and seminal vesicles are grossly unremarkable. There is sigmoid diverticulitis. There is diffuse inflammatory changes of the perisigmoid fat as well as stranding of the pelvic floor  fat. Multiple small pockets of extraluminal gas noted within the pelvis likely representing focally contained microperforation is. There is inflammatory changes and thickening of distal ileum within the pelvis, likely secondary to inflammatory changes of the sigmoid colon. A tract like structure containing pockets of air may be present between the sigmoid colon and adjacent distal small bowel concerning for fistulous formation. Evaluation of these loops of bowel is limited as they are not opacified with oral contrast. There is an ill-defined 3.3 x 2.0 cm fluid density with apparent organized wall within the right hemipelvis concerning for developing abscess. Moderate stool noted within the colon. No evidence of bowel obstruction. Normal appendix. The abdominal aorta and IVC appear patent. No portal venous gas identified. There is no adenopathy. Small fat containing umbilical hernia. The osseous structures appear unremarkable. IMPRESSION: Sigmoid diverticulitis with focally contained microperforation and early abscess formation. An ill-defined tract flu-like structure between the sigmoid colon and adjacent loop of small bowel may represent a fistula. Evaluation of the distal small bowel and sigmoid is limited as this loops of bowel are not filled with oral contrast. Electronically Signed   By: Elgie Collard M.D.   On: 09/08/2015 01:33    Assessment and Plan:   Adam Pugh is a 35 y.o. y/o male who comes in today with a history of diverticulitis with a abscess. The patient also has a thrombosed hemorrhoid on exam. The patient has been told to take sitz bath. He also has a follow-up with surgery for possible resection of his sigmoid area. The patient has been explained the plan and agrees with it.   Note: This dictation was prepared with Dragon dictation along with smaller phrase technology. Any transcriptional errors that result from this process are unintentional.

## 2015-09-19 ENCOUNTER — Ambulatory Visit (INDEPENDENT_AMBULATORY_CARE_PROVIDER_SITE_OTHER): Payer: Medicaid Other | Admitting: Surgery

## 2015-09-19 ENCOUNTER — Encounter: Payer: Self-pay | Admitting: Surgery

## 2015-09-19 ENCOUNTER — Other Ambulatory Visit: Payer: Self-pay

## 2015-09-19 VITALS — BP 146/84 | HR 66 | Temp 98.6°F | Ht 74.0 in | Wt 214.0 lb

## 2015-09-19 DIAGNOSIS — K5732 Diverticulitis of large intestine without perforation or abscess without bleeding: Secondary | ICD-10-CM

## 2015-09-19 MED ORDER — POLYETHYLENE GLYCOL 3350 17 GM/SCOOP PO POWD: 1.0000 | Freq: Once | ORAL | Status: DC

## 2015-09-19 MED ORDER — METRONIDAZOLE 500 MG PO TABS: 500.0000 mg | ORAL_TABLET | Freq: Three times a day (TID) | ORAL | Status: DC

## 2015-09-19 MED ORDER — BISACODYL 5 MG PO TBEC: 20.0000 mg | DELAYED_RELEASE_TABLET | Freq: Once | ORAL | Status: DC

## 2015-09-19 MED ORDER — CIPROFLOXACIN HCL 500 MG PO TABS: 500.0000 mg | ORAL_TABLET | Freq: Two times a day (BID) | ORAL | Status: DC

## 2015-09-19 NOTE — Progress Notes (Signed)
Outpatient Surgical Follow Up  09/19/2015  Adam Pugh is an 35 y.o. male.   CC: Right lower quadrant pain  HPI: This patient seen twice in the hospital with right lower quadrant pain and signs of a perforated abdominal viscus with abscess.  He feels better now with no fevers but is requesting Percocet he is on antibiotics. He has refills on the antibiotics. He denies diarrhea.  Patient was seen by Dr. Marva Panda in the hospital and by Dr. Servando Snare in the office recently. There are pain is that this is perforated diverticulitis.  Past Medical History  Diagnosis Date  . Inflammatory bowel disease (Crohn's disease) (HCC)   . Diverticulitis of large intestine with perforation and abscess without bleeding     Past Surgical History  Procedure Laterality Date  . No past surgeries      Family History  Problem Relation Age of Onset  . Glaucoma Father   . Diabetes Other     Social History:  reports that he has been smoking Cigarettes.  He has been smoking about 0.25 packs per day. He has never used smokeless tobacco. 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 He reports that he does not drink alcohol.  Allergies: No Known Allergies  Medications reviewed.   Review of Systems:   Review of Systems  Constitutional: Negative.   HENT: Negative.   Eyes: Negative.   Respiratory: Negative.   Cardiovascular: Negative.   Gastrointestinal: Positive for abdominal pain. Negative for heartburn, nausea, vomiting, diarrhea, constipation, blood in stool and melena.  Genitourinary: Negative.   Musculoskeletal: Negative.   Skin: Negative.   Neurological: Negative.   Endo/Heme/Allergies: Negative.   Psychiatric/Behavioral: Negative.      Physical Exam:  BP 146/84 mmHg  Pulse 66  Temp(Src) 98.6 F (37 C) (Oral)  Ht 6\' 2"  (1.88 m)  Wt 214 lb (97.07 kg)  BMI 27.46 kg/m2  Physical Exam  Constitutional: He is oriented to person, place, and time and well-developed,  well-nourished, and in no distress. No distress.  HENT:  Head: Normocephalic and atraumatic.  Eyes: Pupils are equal, round, and reactive to light. Right eye exhibits no discharge. Left eye exhibits no discharge. No scleral icterus.  Neck: Normal range of motion.  Cardiovascular: Normal rate, regular rhythm and normal heart sounds.   Pulmonary/Chest: Effort normal and breath sounds normal. No respiratory distress. He has no wheezes. He has no rales.  Abdominal: Soft. Bowel sounds are normal. He exhibits no distension. There is no tenderness.  Musculoskeletal: Normal range of motion. He exhibits no edema.  Lymphadenopathy:    He has no cervical adenopathy.  Neurological: He is alert and oriented to person, place, and time.  Skin: Skin is warm and dry. He is not diaphoretic.  Psychiatric: Mood and affect normal.  Vitals reviewed.     No results found for this or any previous visit (from the past 48 hour(s)). No results found.  Assessment/Plan:  This patient with a history of a perforated viscus and small abscess in the right lower quadrant. His presentation on hospitalization twice has been in the right lower quadrant. This is been consistent. Also of note he is seen 2 separate gastroenterologists to believe that this is perforated diverticulitis in a young male. He has had no further workup other than CT scans. Currently on antibiotics.  My recommendations in this patient is that because the etiology is unclear and the potential for this being Crohn's disease has been entertained I would recommend  a diagnostic CT scan follow-up next week and I will see him in the office and discuss potential diagnostic laparoscopy with possible bowel resection with him. He understood all this and agrees to proceed a bowel prep will be given for possible laparoscopy and bowel resection after his next visit  Lattie Haw, MD, FACS

## 2015-09-19 NOTE — Patient Instructions (Signed)
We have refilled your pain medication and antiobiotics today. Your Cipro and Flagyl have been sent to [facility]. Please take your Percocet prescription to your pharmacy to be filled.  We will arrange a CT next week, I will call you with the date and time of this appointment.  We will then follow-up with you in the office on 10/13/15 at 3pm in [place] to go over results and discuss surgery. We will then arrange the operating room for either 2/15 or 2/16 to take a look in your Abdomen and see what exactly is going on to cause this pain you are having. We will have you complete a bowel prep for this surgery. See your bowel prep information included. These medications have also been sent to your pharmacy at this time.  We have also included a (Blue)Pre-care surgery form for further information.  Call with any questions that you may have and speak with a nurse.

## 2015-09-22 ENCOUNTER — Telehealth: Payer: Self-pay | Admitting: Surgery

## 2015-09-22 MED ORDER — METRONIDAZOLE 500 MG PO TABS: 500.0000 mg | ORAL_TABLET | Freq: Three times a day (TID) | ORAL | Status: DC

## 2015-09-22 MED ORDER — CIPROFLOXACIN HCL 500 MG PO TABS: 500.0000 mg | ORAL_TABLET | Freq: Two times a day (BID) | ORAL | Status: DC

## 2015-09-22 NOTE — Telephone Encounter (Signed)
error 

## 2015-09-22 NOTE — Telephone Encounter (Signed)
Returned phone call to patient at this time. Explained that I had resent his antibiotics to [facility] as requested. He asked about when he can get another prescription of oxycodone and I explained that Dr. Excell Seltzer would need to see him once again for him to have another prescription for pain medications. He did ask however, if he may take Ibuprofen to help with some of the pain. I explained that he may take Ibuprofen with food up to 800mg  daily as needed to help with the pain and reserve the pain medication for only when he has the severe pains. Discouraged use of Tylenol while taking pain medication as this also has tylenol in it. Patient verbalizes understanding.

## 2015-09-22 NOTE — Telephone Encounter (Signed)
Patient requesting to speak with nurse about his medication. Was in office last week Excell Seltzer) and his antibiotics were supposed to be sent in to [facility] on [address]. He states [facility] doesn't have them. It looks like [facility] on [address] is listed as his pharmacy in [place], that is incorrect. His pharmacy is [facility] on [address]. Please send antibiotics to [facility]. Also, he would like to speak with the nurse about his pain medication. Thanks.

## 2015-09-24 ENCOUNTER — Telehealth: Payer: Self-pay | Admitting: Surgery

## 2015-09-24 NOTE — Telephone Encounter (Signed)
Pt advised of pre op date/time and sx date. Sx: 10/16/15 with Dr Ludwig Clarks (diagnostic)--possible sigmoid colectomy. Pre op: 10/09/15 @ 9:00am--office.

## 2015-09-25 ENCOUNTER — Ambulatory Visit
Admission: RE | Admit: 2015-09-25 | Discharge: 2015-09-25 | Disposition: A | Discharge status: Home / Self Care | Payer: Medicaid Other | Source: Ambulatory Visit | Attending: Surgery | Admitting: Surgery

## 2015-09-25 DIAGNOSIS — K5732 Diverticulitis of large intestine without perforation or abscess without bleeding: Secondary | ICD-10-CM

## 2015-09-25 DIAGNOSIS — K639 Disease of intestine, unspecified: Secondary | ICD-10-CM | POA: Insufficient documentation

## 2015-09-25 MED ORDER — IOHEXOL 350 MG/ML SOLN
100.0000 mL | Freq: Once | INTRAVENOUS | Status: AC | PRN
  Administered 2015-09-25: 100 mL via INTRAVENOUS

## 2015-09-26 ENCOUNTER — Other Ambulatory Visit: Payer: Self-pay | Admitting: Surgery

## 2015-09-26 NOTE — Telephone Encounter (Signed)
Returned phone call to patient at this time. Explained again that I would have to have patient seen by a provider before we can refill his pain medication. He did not want to make an appointment at this time.

## 2015-09-26 NOTE — Telephone Encounter (Addendum)
Patient said he is still in pain and asking for a refill on his pain medication patient has been being seen for diverticulitis and has a Laparoscopic sigmoid colectomy scheduled for 2/16 with Excell Seltzer

## 2015-10-01 ENCOUNTER — Encounter: Payer: Self-pay | Admitting: Surgery

## 2015-10-01 ENCOUNTER — Ambulatory Visit: Payer: Self-pay | Admitting: Surgery

## 2015-10-01 ENCOUNTER — Ambulatory Visit (INDEPENDENT_AMBULATORY_CARE_PROVIDER_SITE_OTHER): Payer: Medicaid Other | Admitting: Surgery

## 2015-10-01 ENCOUNTER — Encounter (INDEPENDENT_AMBULATORY_CARE_PROVIDER_SITE_OTHER): Payer: Self-pay

## 2015-10-01 VITALS — BP 157/93 | HR 84 | Temp 98.9°F | Ht 74.0 in | Wt 214.0 lb

## 2015-10-01 DIAGNOSIS — K5732 Diverticulitis of large intestine without perforation or abscess without bleeding: Secondary | ICD-10-CM | POA: Diagnosis not present

## 2015-10-01 MED ORDER — BISACODYL 5 MG PO TBEC: 20.0000 mg | DELAYED_RELEASE_TABLET | Freq: Once | ORAL | Status: DC

## 2015-10-01 MED ORDER — POLYETHYLENE GLYCOL 3350 17 GM/SCOOP PO POWD: 1.0000 | Freq: Once | ORAL | Status: DC

## 2015-10-01 NOTE — Progress Notes (Signed)
Outpatient Surgical Follow Up  10/01/2015  Adam Pugh is an 35 y.o. male.   CC: Lower abdominal pain  HPI: Lower abdominal pain with workup showing probable diverticulitis  Past Medical History  Diagnosis Date  . Inflammatory bowel disease (Crohn's disease) (HCC)   . Diverticulitis of large intestine with perforation and abscess without bleeding     Past Surgical History  Procedure Laterality Date  . No past surgeries      Family History  Problem Relation Age of Onset  . Glaucoma Father   . Diabetes Other     Social History:  reports that he has been smoking Cigarettes.  He has been smoking about 0.25 packs per day. He has never used smokeless tobacco. 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 He reports that he does not drink alcohol.  Allergies: No Known Allergies  Medications reviewed.   Review of Systems:   Review of Systems  Constitutional: Negative.   HENT: Negative.   Eyes: Negative.   Respiratory: Negative.   Cardiovascular: Negative.   Gastrointestinal: Positive for abdominal pain. Negative for nausea and vomiting.  Genitourinary: Negative.   Skin: Negative.      Physical Exam:  BP 157/93 mmHg  Pulse 84  Temp(Src) 98.9 F (37.2 C) (Oral)  Ht  (1.88 m)  Wt 214 lb (97.07 kg)  BMI 27.46 kg/m2  Physical Exam  Constitutional: He is well-developed, well-nourished, and in no distress. No distress.  HENT:  Head: Normocephalic and atraumatic.  Eyes: Pupils are equal, round, and reactive to light. Right eye exhibits no discharge. Left eye exhibits no discharge. No scleral icterus.  Cardiovascular: Normal rate and regular rhythm.   Pulmonary/Chest: Effort normal. No respiratory distress.  Abdominal: Soft. He exhibits no distension. There is no tenderness.  Musculoskeletal: He exhibits no edema.  Skin: He is not diaphoretic.      No results found for this or any previous visit (from the past 48 hour(s)). No results  found.  Assessment/Plan:  This a patient with lower abdominal pain in the differential diagnosis of Crohn's disease versus diverticulitis. Personal review of his CT scan suggests that this is diverticulitis and not Crohn's disease as previously believed without a mind M recommending diagnostic laparoscopy with progression to a sigmoid colectomy after bowel prep. The rationale for this approach is been discussed with the patient the options of observation of been reviewed and the risks of bleeding infection and anastomotic leak recurrent disease negative laparoscopy or laparotomy were all reviewed with him and the potential for an ostomy reviewed as well he understood and agreed to proceed  Lattie Haw, MD, FACS

## 2015-10-09 ENCOUNTER — Other Ambulatory Visit: Payer: Medicaid Other

## 2015-10-13 ENCOUNTER — Ambulatory Visit: Payer: Self-pay | Admitting: Surgery

## 2015-10-13 ENCOUNTER — Encounter
Admission: RE | Admit: 2015-10-13 | Discharge: 2015-10-13 | Disposition: A | Discharge status: Home / Self Care | Payer: Medicaid Other | Source: Ambulatory Visit | Attending: Surgery | Admitting: Surgery

## 2015-10-13 DIAGNOSIS — Z01812 Encounter for preprocedural laboratory examination: Secondary | ICD-10-CM | POA: Diagnosis present

## 2015-10-13 LAB — COMPREHENSIVE METABOLIC PANEL
ALBUMIN: 3.9 g/dL (ref 3.5–5.0)
ALT: 27 U/L (ref 17–63)
AST: 23 U/L (ref 15–41)
Alkaline Phosphatase: 39 U/L (ref 38–126)
Anion gap: 7 (ref 5–15)
BILIRUBIN TOTAL: 1 mg/dL (ref 0.3–1.2)
BUN: 12 mg/dL (ref 6–20)
CO2: 26 mmol/L (ref 22–32)
CREATININE: 1.06 mg/dL (ref 0.61–1.24)
Calcium: 9 mg/dL (ref 8.9–10.3)
Chloride: 107 mmol/L (ref 101–111)
GFR calc Af Amer: >60 mL/min (ref >60)
GLUCOSE: 122 mg/dL — AB (ref 65–99)
POTASSIUM: 3.6 mmol/L (ref 3.5–5.1)
Sodium: 140 mmol/L (ref 135–145)
TOTAL PROTEIN: 6.8 g/dL (ref 6.5–8.1)

## 2015-10-13 LAB — CBC WITH DIFFERENTIAL/PLATELET
BASOS ABS: 0 10*3/uL (ref 0–0.1)
BASOS PCT: 0 %
Eosinophils Absolute: 0.3 10*3/uL (ref 0–0.7)
Eosinophils Relative: 3 %
HEMATOCRIT: 41.1 % (ref 40.0–52.0)
HEMOGLOBIN: 13.5 g/dL (ref 13.0–18.0)
LYMPHS PCT: 18 %
Lymphs Abs: 1.5 10*3/uL (ref 1.0–3.6)
MCH: 28.4 pg (ref 26.0–34.0)
MCHC: 33 g/dL (ref 32.0–36.0)
MCV: 86.1 fL (ref 80.0–100.0)
MONO ABS: 0.7 10*3/uL (ref 0.2–1.0)
Monocytes Relative: 8 %
NEUTROS ABS: 5.6 10*3/uL (ref 1.4–6.5)
NEUTROS PCT: 71 %
Platelets: 182 10*3/uL (ref 150–440)
RBC: 4.78 MIL/uL (ref 4.40–5.90)
RDW: 15 % — AB (ref 11.5–14.5)
WBC: 8 10*3/uL (ref 3.8–10.6)

## 2015-10-13 LAB — SURGICAL PCR SCREEN
MRSA, PCR: NEGATIVE
STAPHYLOCOCCUS AUREUS: NEGATIVE

## 2015-10-13 NOTE — Patient Instructions (Signed)
  Your procedure is scheduled on: Thursday Feb. 16, 2017. Report to Same Day Surgery. To find out your arrival time please call [phone] between 1PM - 3PM on Wednesday Feb. 15, 2017 .  Remember: Instructions that are not followed completely may result in serious medical risk, up to and including death, or upon the discretion of your surgeon and anesthesiologist your surgery may need to be rescheduled.    _x___ 1. Do not eat food or drink liquids after midnight. No gum chewing or hard candies.     _x___ 2. No Alcohol for 24 hours before or after surgery.   ____ 3. Bring all medications with you on the day of surgery if instructed.    __x__ 4. Notify your doctor if there is any change in your medical condition     (cold, fever, infections).     Do not wear jewelry, make-up, hairpins, clips or nail polish.  Do not wear lotions, powders, or perfumes. You may wear deodorant.  Do not shave 48 hours prior to surgery. Men may shave face and neck.  Do not bring valuables to the hospital.    [facility] is not responsible for any belongings or valuables.               Contacts, dentures or bridgework may not be worn into surgery.  Leave your suitcase in the car. After surgery it may be brought to your room.  For patients admitted to the hospital, discharge time is determined by your treatment team.   Patients discharged the day of surgery will not be allowed to drive home.    Please read over the following fact sheets that you were given:   [facility] Preparing for Surgery  _x__ Take these medicines the morning of surgery with A SIP OF WATER:    1. pantoprazole (PROTONIX)    ____ Fleet Enema (as directed)   __x__ Use CHG Soap as directed  ____ Use inhalers on the day of surgery  ____ Stop metformin 2 days prior to surgery    ____ Take 1/2 of usual insulin dose the night before surgery and none on the morning of surgery.   ____ Stop Coumadin/Plavix/aspirin on does not  apply.  ____ Stop Anti-inflammatories on does not apply.  OK to take Tylenol for pain.   ____ Stop supplements until after surgery.    ____ Bring C-Pap to the hospital.

## 2015-10-16 ENCOUNTER — Encounter: Payer: Self-pay | Admitting: Anesthesiology

## 2015-10-16 ENCOUNTER — Ambulatory Visit
Admission: RE | Admit: 2015-10-16 | Discharge: 2015-10-16 | Disposition: A | Discharge status: Home / Self Care | Payer: Medicaid Other | Source: Ambulatory Visit | Attending: Surgery | Admitting: Surgery

## 2015-10-16 ENCOUNTER — Encounter
Admission: RE | Disposition: A | Discharge status: Home / Self Care | Payer: Self-pay | Source: Ambulatory Visit | Attending: Surgery

## 2015-10-16 ENCOUNTER — Encounter: Payer: Self-pay | Admitting: *Deleted

## 2015-10-16 DIAGNOSIS — Z833 Family history of diabetes mellitus: Secondary | ICD-10-CM | POA: Diagnosis not present

## 2015-10-16 DIAGNOSIS — F17210 Nicotine dependence, cigarettes, uncomplicated: Secondary | ICD-10-CM | POA: Insufficient documentation

## 2015-10-16 DIAGNOSIS — K5090 Crohn's disease, unspecified, without complications: Secondary | ICD-10-CM | POA: Insufficient documentation

## 2015-10-16 DIAGNOSIS — Z83511 Family history of glaucoma: Secondary | ICD-10-CM | POA: Insufficient documentation

## 2015-10-16 DIAGNOSIS — F1410 Cocaine abuse, uncomplicated: Secondary | ICD-10-CM | POA: Diagnosis not present

## 2015-10-16 DIAGNOSIS — R1030 Lower abdominal pain, unspecified: Secondary | ICD-10-CM | POA: Diagnosis not present

## 2015-10-16 DIAGNOSIS — Z5309 Procedure and treatment not carried out because of other contraindication: Secondary | ICD-10-CM | POA: Insufficient documentation

## 2015-10-16 DIAGNOSIS — F1210 Cannabis abuse, uncomplicated: Secondary | ICD-10-CM | POA: Diagnosis not present

## 2015-10-16 LAB — URINE DRUG SCREEN, QUALITATIVE (ARMC ONLY)
Amphetamines, Ur Screen: NOT DETECTED
BARBITURATES, UR SCREEN: NOT DETECTED
BENZODIAZEPINE, UR SCRN: NOT DETECTED
CANNABINOID 50 NG, UR ~~LOC~~: NOT DETECTED
Cocaine Metabolite,Ur ~~LOC~~: POSITIVE — AB
MDMA (Ecstasy)Ur Screen: NOT DETECTED
Methadone Scn, Ur: NOT DETECTED
Opiate, Ur Screen: NOT DETECTED
Phencyclidine (PCP) Ur S: NOT DETECTED
TRICYCLIC, UR SCREEN: NOT DETECTED

## 2015-10-16 SURGERY — LAPAROSCOPY, DIAGNOSTIC
Anesthesia: General

## 2015-10-16 MED ORDER — HEPARIN SODIUM (PORCINE) 5000 UNIT/ML IJ SOLN
5000.0000 [IU] | Freq: Once | INTRAMUSCULAR | Status: AC
  Administered 2015-10-16: 5000 [IU] via SUBCUTANEOUS

## 2015-10-16 MED ORDER — CHLORHEXIDINE GLUCONATE 4 % EX LIQD: 1.0000 "application " | Freq: Once | CUTANEOUS | Status: DC

## 2015-10-16 MED ORDER — DEXTROSE 5 % IV SOLN
2.0000 g | INTRAVENOUS | Status: DC
  Filled 2015-10-16: qty 2

## 2015-10-16 MED ORDER — LACTATED RINGERS IV SOLN: INTRAVENOUS | Status: DC

## 2015-10-16 MED ORDER — HEPARIN SODIUM (PORCINE) 5000 UNIT/ML IJ SOLN
INTRAMUSCULAR | Status: AC
  Administered 2015-10-16: 5000 [IU] via SUBCUTANEOUS
  Filled 2015-10-16: qty 1

## 2015-10-16 SURGICAL SUPPLY — 31 items
ADHESIVE MASTISOL STRL (MISCELLANEOUS) IMPLANT
BAG COUNTER SPONGE EZ (MISCELLANEOUS) IMPLANT
CANISTER SUCT 1200ML W/VALVE (MISCELLANEOUS) IMPLANT
CATH TRAY 16F METER LATEX (MISCELLANEOUS) IMPLANT
ELECT REM PT RETURN 9FT ADLT (ELECTROSURGICAL)
ELECTRODE REM PT RTRN 9FT ADLT (ELECTROSURGICAL) IMPLANT
GAUZE SPONGE 4X4 12PLY STRL (GAUZE/BANDAGES/DRESSINGS) IMPLANT
GLOVE BIO SURGEON STRL SZ8 (GLOVE) IMPLANT
GOWN STRL REUS W/ TWL LRG LVL3 (GOWN DISPOSABLE) IMPLANT
GOWN STRL REUS W/TWL LRG LVL3 (GOWN DISPOSABLE)
IRRIGATION STRYKERFLOW (MISCELLANEOUS) IMPLANT
IRRIGATOR STRYKERFLOW (MISCELLANEOUS)
IV NS 1000ML (IV SOLUTION)
IV NS 1000ML BAXH (IV SOLUTION) IMPLANT
KIT RM TURNOVER STRD PROC AR (KITS) IMPLANT
LABEL OR SOLS (LABEL) IMPLANT
NEEDLE FILTER BLUNT 18X 1/2SAF (NEEDLE)
NEEDLE FILTER BLUNT 18X1 1/2 (NEEDLE) IMPLANT
NEEDLE VERESS 14GA 120MM (NEEDLE) IMPLANT
NS IRRIG 500ML POUR BTL (IV SOLUTION) IMPLANT
PACK LAP CHOLECYSTECTOMY (MISCELLANEOUS) IMPLANT
SCISSORS METZENBAUM CVD 33 (INSTRUMENTS) IMPLANT
SEAL FOR SCOPE WARMER C3101 (MISCELLANEOUS) IMPLANT
SEPRAFILM PROCEDURAL PACK 3X5 (MISCELLANEOUS) IMPLANT
STRIP CLOSURE SKIN 1/2X4 (GAUZE/BANDAGES/DRESSINGS) IMPLANT
SUT ETHILON 5-0 FS-2 18 BLK (SUTURE) IMPLANT
TROCAR XCEL NON-BLD 11X100MML (ENDOMECHANICALS) IMPLANT
TROCAR XCEL NON-BLD 5MMX100MML (ENDOMECHANICALS) IMPLANT
TROCAR XCEL UNIV SLVE 11M 100M (ENDOMECHANICALS) IMPLANT
TUBING INSUFFLATOR HI FLOW (MISCELLANEOUS) IMPLANT
WATER STERILE IRR 1000ML POUR (IV SOLUTION) IMPLANT

## 2015-10-16 NOTE — Anesthesia Preprocedure Evaluation (Signed)
Anesthesia Evaluation  Patient identified by MRN, date of birth, ID band Patient awake    Reviewed: Allergy & Precautions, H&P , NPO status , Patient's Chart, lab work & pertinent test results, reviewed documented beta blocker date and time   Airway Mallampati: II  TM Distance: >3 FB Neck ROM: full    Dental  (+) Teeth Intact   Pulmonary neg pulmonary ROS, Current Smoker,    Pulmonary exam normal        Cardiovascular negative cardio ROS Normal cardiovascular exam Rhythm:regular Rate:Normal     Neuro/Psych negative neurological ROS  negative psych ROS   GI/Hepatic negative GI ROS, Neg liver ROS,   Endo/Other  negative endocrine ROS  Renal/GU Renal diseasenegative Renal ROS  negative genitourinary   Musculoskeletal   Abdominal   Peds  Hematology negative hematology ROS (+)   Anesthesia Other Findings Past Medical History:   Inflammatory bowel disease (Crohn's disease) (*              Diverticulitis of large intestine with perfora*            Past Surgical History:   NO PAST SURGERIES                                           BMI    Body Mass Index   27.46 kg/m 2     Reproductive/Obstetrics negative OB ROS                             Anesthesia Physical Anesthesia Plan  ASA: II  Anesthesia Plan: General ETT   Post-op Pain Management:    Induction:   Airway Management Planned:   Additional Equipment:   Intra-op Plan:   Post-operative Plan:   Informed Consent: I have reviewed the patients History and Physical, chart, labs and discussed the procedure including the risks, benefits and alternatives for the proposed anesthesia with the patient or authorized representative who has indicated his/her understanding and acceptance.   Dental Advisory Given  Plan Discussed with: CRNA  Anesthesia Plan Comments: (Case cancelled after Positive drug screen for cocaine)         Anesthesia Quick Evaluation

## 2015-10-16 NOTE — Progress Notes (Signed)
Preoperative Review   Patient is met in the preoperative holding area. The history is reviewed in the chart and with the patient. I personally reviewed the options and rationale as well as the risks of this procedure that have been previously discussed with the patient. All questions asked by the patient and/or family were answered to their satisfaction.  I reviewed again for the patient the rationale for a diagnostic laparoscopy to determine the diagnosis more firmly between Crohn's disease and diverticulitis. I also then reviewed for him the potential for a possible but not certain progression to an open colectomy with the risks of colostomy etc. all risks were detailed for him again he understood and agreed to proceed.  Patient agrees to proceed with this procedure at this time.  Florene Glen M.D. FACS

## 2015-10-16 NOTE — OR Nursing (Signed)
Dr Madelaine Bhat and Dr Excell Seltzer in to see pt. Surgery canceled due to positive drug. Dr Excell Seltzer instructed pt to call office to reschedule

## 2015-10-16 NOTE — OR Nursing (Signed)
Pt left unit ambulatory.

## 2015-10-16 NOTE — Progress Notes (Signed)
H and tested positive for cocaine use in the preop area therefore per anesthesia he has been canceled and will be rescheduled at a later date

## 2015-10-17 ENCOUNTER — Telehealth: Payer: Self-pay | Admitting: Surgery

## 2015-10-17 NOTE — Telephone Encounter (Signed)
Patient called and said he needed to schedule a surgery not a post op

## 2015-10-20 NOTE — Telephone Encounter (Signed)
I have called patient back about rescheduling his surgery. I have advised him that I have emailed Dr Excell Seltzer to see if he should be seen back in clinic prior to scheduling surgery. i will contact the patient back once a response is obtained.

## 2015-10-24 NOTE — Telephone Encounter (Signed)
I have called patient back to discuss surgery date options with Dr Excell Seltzer for Laparoscopic diagnostic. No answer. I have left a detailed message for patient to call me back.

## 2015-10-28 ENCOUNTER — Telehealth: Payer: Self-pay

## 2015-10-28 ENCOUNTER — Telehealth: Payer: Self-pay | Admitting: Surgery

## 2015-10-28 MED ORDER — POLYETHYLENE GLYCOL 3350 17 GM/SCOOP PO POWD: 1.0000 | Freq: Once | ORAL | Status: DC

## 2015-10-28 MED ORDER — BISACODYL 5 MG PO TBEC: 20.0000 mg | DELAYED_RELEASE_TABLET | Freq: Once | ORAL | Status: DC

## 2015-10-28 NOTE — Telephone Encounter (Signed)
Pt advised of pre op date/time and sx date. Sx: 11/18/15 with Dr Ludwig Clarks Diagnostic possible sigmoid colectomy.  Pre op: 11/10/15 between 9-1pm--Phone.   Patient made aware to call [phone], between 1-3:00pm the day before surgery, to find out what time to arrive.

## 2015-10-28 NOTE — Telephone Encounter (Signed)
Patient called needing prep kit called in to pharmacy since he was unable to go through with previously scheduled surgery.  Medication sent to pharmacy.

## 2015-11-10 ENCOUNTER — Other Ambulatory Visit: Payer: Medicaid Other

## 2015-11-10 ENCOUNTER — Encounter: Payer: Self-pay | Admitting: *Deleted

## 2015-11-10 NOTE — Patient Instructions (Signed)
  Your procedure is scheduled on:11/18/15 Report to Day Surgery. [facility]OND FLOOR To find out your arrival time please call [phone] between 1PM - 3PM on 11/17/15.  Remember: Instructions that are not followed completely may result in serious medical risk, up to and including death, or upon the discretion of your surgeon and anesthesiologist your surgery may need to be rescheduled.    _X__ 1. Do not eat food or drink liquids after midnight. No gum chewing or hard candies.     _X___ 2. No Alcohol for 24 hours before or after surgery.   ____ 3. Bring all medications with you on the day of surgery if instructed.    _X_ 4. Notify your doctor if there is any change in your medical condition     (cold, fever, infections).     Do not wear jewelry, make-up, hairpins, clips or nail polish.  Do not wear lotions, powders, or perfumes. You may wear deodorant.  Do not shave 48 hours prior to surgery. Men may shave face and neck.  Do not bring valuables to the hospital.    [facility] is not responsible for any belongings or valuables.               Contacts, dentures or bridgework may not be worn into surgery.  Leave your suitcase in the car. After surgery it may be brought to your room.  For patients admitted to the hospital, discharge time is determined by your                treatment team.   Patients discharged the day of surgery will not be allowed to drive home.   Please read over the following fact sheets that you were given:   Surgical Site Infection Prevention   ____ Take these medicines the morning of surgery with A SIP OF WATER:    1. NONE  2.   3.   4.  5.  6.  ____ Fleet Enema (as directed)   __X__ Use CHG Soap as directed  ____ Use inhalers on the day of surgery  ____ Stop metformin 2 days prior to surgery    ____ Take 1/2 of usual insulin dose the night before surgery and none on the morning of surgery.   ____ Stop Coumadin/Plavix/aspirin on   ____  Stop Anti-inflammatories on    ____ Stop supplements until after surgery.    ____ Bring C-Pap to the hospital.

## 2015-11-14 NOTE — Pre-Procedure Instructions (Signed)
CALLED AND SPOKE WITH ANGIE AT DR COOPERS OFFICE AND INFORMED HER THAT THERE ARE STILL NO ORDERS IN EPIC FROM DR COOPER. THIS WAS CALLED TO OFFICE ON 11-10-15 AND OFFICE STATED THEY WOULD BE IN LATE ON 3-13- BUT STILL NO ORDERS.  ANGIE TO LET AMBER RN KNOW OF THIS.

## 2015-11-17 ENCOUNTER — Other Ambulatory Visit: Payer: Self-pay

## 2015-11-17 DIAGNOSIS — K5732 Diverticulitis of large intestine without perforation or abscess without bleeding: Secondary | ICD-10-CM

## 2015-11-17 NOTE — Progress Notes (Signed)
Discussed consents with Dr. Excell Seltzer on ascom 11/17/15 @ 4pm - advises - use the following consent in chart "Diagnostic Laparoscopy with possibility of colectomy with colostomy".  Also advises he is aware re UDS as pt was cancelled for surgery two weeks ago for positive screen

## 2015-11-18 ENCOUNTER — Ambulatory Visit
Admission: RE | Admit: 2015-11-18 | Discharge: 2015-11-18 | Disposition: A | Discharge status: Home / Self Care | Payer: Medicaid Other | Source: Ambulatory Visit | Attending: Surgery | Admitting: Surgery

## 2015-11-18 ENCOUNTER — Encounter: Payer: Self-pay | Admitting: Anesthesiology

## 2015-11-18 ENCOUNTER — Encounter
Admission: RE | Disposition: A | Discharge status: Home / Self Care | Payer: Self-pay | Source: Ambulatory Visit | Attending: Surgery

## 2015-11-18 DIAGNOSIS — K5732 Diverticulitis of large intestine without perforation or abscess without bleeding: Secondary | ICD-10-CM | POA: Diagnosis present

## 2015-11-18 DIAGNOSIS — Z5309 Procedure and treatment not carried out because of other contraindication: Secondary | ICD-10-CM | POA: Insufficient documentation

## 2015-11-18 LAB — URINE DRUG SCREEN, QUALITATIVE (ARMC ONLY)
Amphetamines, Ur Screen: NOT DETECTED
BARBITURATES, UR SCREEN: NOT DETECTED
Benzodiazepine, Ur Scrn: NOT DETECTED
COCAINE METABOLITE, UR ~~LOC~~: POSITIVE — AB
Cannabinoid 50 Ng, Ur ~~LOC~~: NOT DETECTED
MDMA (ECSTASY) UR SCREEN: NOT DETECTED
METHADONE SCREEN, URINE: NOT DETECTED
OPIATE, UR SCREEN: NOT DETECTED
Phencyclidine (PCP) Ur S: NOT DETECTED
Tricyclic, Ur Screen: NOT DETECTED

## 2015-11-18 SURGERY — LAPAROSCOPY, DIAGNOSTIC
Anesthesia: Choice

## 2015-11-18 MED ORDER — CIPROFLOXACIN IN D5W 400 MG/200ML IV SOLN: 400.0000 mg | INTRAVENOUS | Status: DC

## 2015-11-18 MED ORDER — FAMOTIDINE 20 MG PO TABS
ORAL_TABLET | ORAL | Status: AC
  Administered 2015-11-18: 20 mg via ORAL
  Filled 2015-11-18: qty 1

## 2015-11-18 MED ORDER — LACTATED RINGERS IV SOLN
INTRAVENOUS | Status: DC
  Administered 2015-11-18: 07:00:00 via INTRAVENOUS

## 2015-11-18 MED ORDER — FAMOTIDINE 20 MG PO TABS
20.0000 mg | ORAL_TABLET | Freq: Once | ORAL | Status: AC
  Administered 2015-11-18: 20 mg via ORAL

## 2015-11-18 MED ORDER — CHLORHEXIDINE GLUCONATE 4 % EX LIQD: 1.0000 "application " | Freq: Once | CUTANEOUS | Status: DC

## 2015-11-18 SURGICAL SUPPLY — 93 items
ADHESIVE MASTISOL STRL (MISCELLANEOUS) IMPLANT
BAG COUNTER SPONGE EZ (MISCELLANEOUS) IMPLANT
BLADE SURG SZ10 CARB STEEL (BLADE) IMPLANT
CANISTER SUCT 1200ML W/VALVE (MISCELLANEOUS) IMPLANT
CANNULA DILATOR 12 W/SLV (CANNULA) IMPLANT
CANNULA DILATOR 12MM W/SLV (CANNULA)
CATH TRAY 16F METER LATEX (MISCELLANEOUS) IMPLANT
CHLORAPREP W/TINT 26ML (MISCELLANEOUS) IMPLANT
CLEANER CAUTERY TIP 5X5 PAD (MISCELLANEOUS) IMPLANT
CLIP TI LARGE 6 (CLIP) IMPLANT
CLIP TI MEDIUM 6 (CLIP) IMPLANT
CLOSURE WOUND 1/2 X4 (GAUZE/BANDAGES/DRESSINGS)
COUNTER SPONGE BAG EZ (MISCELLANEOUS)
COVER CLAMP SIL LG PBX B (MISCELLANEOUS) IMPLANT
DRAPE LAPAROTOMY 100X77 ABD (DRAPES) IMPLANT
DRAPE LEGGINS SURG 28X43 STRL (DRAPES) IMPLANT
DRSG OPSITE POSTOP 4X10 (GAUZE/BANDAGES/DRESSINGS) IMPLANT
DRSG OPSITE POSTOP 4X8 (GAUZE/BANDAGES/DRESSINGS) IMPLANT
DRSG TELFA 3X8 NADH (GAUZE/BANDAGES/DRESSINGS) IMPLANT
ELECT CAUTERY BLADE 6.4 (BLADE) IMPLANT
ELECT REM PT RETURN 9FT ADLT (ELECTROSURGICAL)
ELECTRODE REM PT RTRN 9FT ADLT (ELECTROSURGICAL) IMPLANT
GAUZE SPONGE 4X4 12PLY STRL (GAUZE/BANDAGES/DRESSINGS) IMPLANT
GAUZE STRETCH 2X75IN STRL (MISCELLANEOUS) IMPLANT
GLOVE BIO SURGEON STRL SZ8 (GLOVE) IMPLANT
GLOVE INDICATOR 8.0 STRL GRN (GLOVE) IMPLANT
GOWN STRL REUS W/ TWL LRG LVL3 (GOWN DISPOSABLE) IMPLANT
GOWN STRL REUS W/ TWL XL LVL3 (GOWN DISPOSABLE) IMPLANT
GOWN STRL REUS W/TWL LRG LVL3 (GOWN DISPOSABLE)
GOWN STRL REUS W/TWL XL LVL3 (GOWN DISPOSABLE)
HANDLE YANKAUER SUCT BULB TIP (MISCELLANEOUS) IMPLANT
IRRIGATION STRYKERFLOW (MISCELLANEOUS) IMPLANT
IRRIGATOR STRYKERFLOW (MISCELLANEOUS)
IV NS 1000ML (IV SOLUTION)
IV NS 1000ML BAXH (IV SOLUTION) IMPLANT
KIT CATH CVC 3 LUMEN 7FR 8IN (MISCELLANEOUS) IMPLANT
KIT RM TURNOVER STRD PROC AR (KITS) IMPLANT
LABEL OR SOLS (LABEL) IMPLANT
LIGASURE MARYLAND LAP STAND (ELECTROSURGICAL) IMPLANT
NDL SAFETY 22GX1.5 (NEEDLE) IMPLANT
NEEDLE FILTER BLUNT 18X 1/2SAF (NEEDLE)
NEEDLE FILTER BLUNT 18X1 1/2 (NEEDLE) IMPLANT
NEEDLE VERESS 14GA 120MM (NEEDLE) IMPLANT
NS IRRIG 1000ML POUR BTL (IV SOLUTION) IMPLANT
NS IRRIG 500ML POUR BTL (IV SOLUTION) IMPLANT
PACK BASIN MAJOR ARMC (MISCELLANEOUS) IMPLANT
PACK COLON CLEAN CLOSURE (MISCELLANEOUS) IMPLANT
PACK LAP CHOLECYSTECTOMY (MISCELLANEOUS) IMPLANT
PAD CLEANER CAUTERY TIP 5X5 (MISCELLANEOUS)
PENCIL ELECTRO HAND CTR (MISCELLANEOUS) IMPLANT
RELOAD PROXIMATE 30MM BLUE (ENDOMECHANICALS) IMPLANT
RELOAD STAPLER LINEAR PROX 30 (STAPLE) IMPLANT
SCISSORS METZENBAUM CVD 33 (INSTRUMENTS) IMPLANT
SEAL FOR SCOPE WARMER C3101 (MISCELLANEOUS) IMPLANT
SEPRAFILM MEMBRANE 5X6 (MISCELLANEOUS) IMPLANT
SEPRAFILM PROCEDURAL PACK 3X5 (MISCELLANEOUS) IMPLANT
SET YANKAUER POOLE SUCT (MISCELLANEOUS) IMPLANT
SHEARS HARMONIC ACE PLUS 36CM (ENDOMECHANICALS) IMPLANT
SPONGE LAP 18X18 5 PK (GAUZE/BANDAGES/DRESSINGS) IMPLANT
STAPLER AUT SUT LDS 15W (STAPLE) IMPLANT
STAPLER PROXIMATE 75MM BLUE (STAPLE) IMPLANT
STAPLER RELOAD LINEAR PROX 30 (STAPLE)
STAPLER SKIN PROX 35W (STAPLE) IMPLANT
STRIP CLOSURE SKIN 1/2X4 (GAUZE/BANDAGES/DRESSINGS) IMPLANT
SUT CHROMIC 2-0 (SUTURE) IMPLANT
SUT ETHILON 5-0 FS-2 18 BLK (SUTURE) IMPLANT
SUT MNCRL 3-0 UNDYED SH (SUTURE) IMPLANT
SUT MONOCRYL 3-0 UNDYED (SUTURE)
SUT PDS AB 1 CT1 27 (SUTURE) IMPLANT
SUT PDS AB 1 TP1 54 (SUTURE) IMPLANT
SUT PROLENE 0 CT 1 30 (SUTURE) IMPLANT
SUT SILK 0 (SUTURE)
SUT SILK 0 30XBRD TIE 6 (SUTURE) IMPLANT
SUT SILK 3 0 (SUTURE)
SUT SILK 3-0 (SUTURE) IMPLANT
SUT SILK 3-0 18XBRD TIE 12 (SUTURE) IMPLANT
SUT VIC AB 0 SH 27 (SUTURE) IMPLANT
SUT VIC AB 1 CTX 27 (SUTURE) IMPLANT
SUT VIC AB 2-0 BRD 54 (SUTURE) IMPLANT
SUT VIC AB 2-0 CT1 27 (SUTURE)
SUT VIC AB 2-0 CT1 TAPERPNT 27 (SUTURE) IMPLANT
SUT VIC AB 3-0 SH 27 (SUTURE)
SUT VIC AB 3-0 SH 27X BRD (SUTURE) IMPLANT
SUT VICRYL 0 TIES 12 18 (SUTURE) IMPLANT
SYR 3ML LL SCALE MARK (SYRINGE) IMPLANT
SYR BULB IRRIG 60ML STRL (SYRINGE) IMPLANT
SYRINGE 10CC LL (SYRINGE) IMPLANT
TROCAR XCEL NON-BLD 11X100MML (ENDOMECHANICALS) IMPLANT
TROCAR XCEL NON-BLD 5MMX100MML (ENDOMECHANICALS) IMPLANT
TROCAR XCEL UNIV SLVE 11M 100M (ENDOMECHANICALS) IMPLANT
TUBING INSUFFLATOR HEATED (MISCELLANEOUS) IMPLANT
TUBING INSUFFLATOR HI FLOW (MISCELLANEOUS) IMPLANT
WATER STERILE IRR 1000ML POUR (IV SOLUTION) IMPLANT

## 2015-11-18 NOTE — Progress Notes (Signed)
Patient tested positive for cocaine a second time requiring cancellation of his case a second time. The reason for canceling his case due to safety issues was reviewed with him again and we will attempt to reschedule at a later date.

## 2015-11-18 NOTE — OR Nursing (Signed)
Dr. Mordecai Rasmussen in to see patient. Discussed positive drug screen with patient.  Case cancelled per Dr Mordecai Rasmussen.  Dr. Excell Seltzer in to see patient as well.

## 2015-11-18 NOTE — OR Nursing (Signed)
Anesthesia notified of positive drug screen.

## 2015-11-19 ENCOUNTER — Telehealth: Payer: Self-pay | Admitting: Surgery

## 2015-11-19 NOTE — Telephone Encounter (Signed)
I have called patient at both number provided in chart to make an office visit for patient to discuss another surgery date for May. No answer. I have left a message on both voicemail's.   Patient will need an office visit with Dr Excell Seltzer so that we can schedule his surgery in May.

## 2015-11-25 NOTE — Telephone Encounter (Signed)
Patient has called back and made office appointment with Dr Excell Seltzer on 12/10/15 @ 9:00am in [place] to discuss another surgery date.

## 2015-12-10 ENCOUNTER — Ambulatory Visit: Payer: Medicaid Other | Admitting: Surgery

## 2015-12-11 ENCOUNTER — Telehealth: Payer: Self-pay | Admitting: Surgery

## 2015-12-11 NOTE — Telephone Encounter (Signed)
Spoke with Dr. Excell Seltzer regarding this matter. We will not reschedule the patient until I speak with the Surgeon due to noncompliance with appointments and surgery cancellation.  Message sent to Dr. Excell Seltzer at this time. Will await notification on whether patient can be rescheduled or not.

## 2015-12-11 NOTE — Telephone Encounter (Signed)
Patient called check his appointment time today. I explained it was yesterday 4/12. I told him I would have Amber call him to discuss any further appointments.

## 2015-12-18 NOTE — Telephone Encounter (Deleted)
Gastroenterology Pre-Procedure Review  Request Date: *** Requesting Physician: Dr. Marland Kitchen  PATIENT REVIEW QUESTIONS: The patient responded to the following health history questions as indicated:    1. Are you having any GI issues? yes (Diarrhea x 2 months) 2. Do you have a personal history of Polyps? no 3. Do you have a family history of Colon Cancer or Polyps? no 4. Diabetes Mellitus? no 5. Joint replacements in the past 12 months?no 6. Major health problems in the past 3 months?no 7. Any artificial heart valves, MVP, or defibrillator?no    MEDICATIONS & ALLERGIES:    Patient reports the following regarding taking any anticoagulation/antiplatelet therapy:   Plavix, Coumadin, Eliquis, Xarelto, Lovenox, Pradaxa, Brilinta, or Effient? no Aspirin? no  Patient confirms/reports the following medications:  No current outpatient prescriptions on file.   No current facility-administered medications for this visit.    Patient confirms/reports the following allergies:  No Known Allergies  No orders of the defined types were placed in this encounter.    AUTHORIZATION INFORMATION Primary Insurance: 1D#: Group #:  Secondary Insurance: 1D#: Group #:  SCHEDULE INFORMATION: Date:  Time: Location:

## 2015-12-23 NOTE — Telephone Encounter (Signed)
Spoke with Dr. Excell Seltzer who would like to see patient in office prior to scheduling surgery once again.   Called patient to get him rescheduled for an office appointment. No answer. Left voicemail for return phone call.  If patient returns phone call, please place patient on 5/5 or 6/9 to meet with Dr. Excell Seltzer to update H&P.

## 2015-12-23 NOTE — Telephone Encounter (Signed)
Called patient once again at this time to schedule patient's appointment. No answer. Left voicemail to return phone call and schedule appointment with Dr. Excell Seltzer.

## 2016-01-02 ENCOUNTER — Encounter: Payer: Self-pay | Admitting: Surgery

## 2016-01-02 ENCOUNTER — Ambulatory Visit (INDEPENDENT_AMBULATORY_CARE_PROVIDER_SITE_OTHER): Payer: Medicaid Other | Admitting: Surgery

## 2016-01-02 VITALS — BP 137/91 | HR 62 | Temp 98.3°F | Ht 74.0 in | Wt 215.0 lb

## 2016-01-02 DIAGNOSIS — K5732 Diverticulitis of large intestine without perforation or abscess without bleeding: Secondary | ICD-10-CM

## 2016-01-02 NOTE — Patient Instructions (Signed)
Give [place] a call if you do not feel good.

## 2016-01-02 NOTE — Progress Notes (Signed)
Outpatient Surgical Follow Up  01/02/2016  Adam Pugh is an 35 y.o. male.   CC:abd pain  HPI: This patient with a history of diverticulitis the diagnosis is somewhat in question and the Crohn's disease is been entertained as well. He has missed multiple appointments and we had to cancel 2 separate operative procedures on the day of surgery due to cocaine abuse and cocaine positivity. 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 Inflammatory bowel disease (Crohn's disease) (HCC)   . Diverticulitis of large intestine with perforation and abscess without bleeding     Past Surgical History  Procedure Laterality Date  . No past surgeries      Family History  Problem Relation Age of Onset  . Glaucoma Father   . Diabetes Other     Social History:  reports that he has been smoking Cigarettes.  He has been smoking about 0.25 packs per day. He has never used smokeless tobacco. He reports that he does not drink alcohol or use illicit drugs.  Allergies: No Known Allergies  Medications reviewed.   Review of Systems:   Review of Systems  Constitutional: Negative.   HENT: Negative.   Eyes: Negative.   Respiratory: Negative.   Cardiovascular: Negative.   Gastrointestinal: Negative.   Genitourinary: Negative.   Musculoskeletal: Negative.   Skin: Negative.   Neurological: Negative.      Physical Exam:  BP 137/91 mmHg  Pulse 62  Temp(Src) 98.3 F (36.8 C) (Oral)  Ht  (1.88 m)  Wt 215 lb (97.523 kg)  BMI 27.59 kg/m2  Physical Exam  Constitutional: He is oriented to person, place, and time and well-developed, well-nourished, and in no distress. No distress.  HENT:  Head: Normocephalic and atraumatic.  Neck: Normal range of motion.  Cardiovascular: Normal rate and regular rhythm.   Pulmonary/Chest: Effort normal and breath sounds  normal. No respiratory distress. He has no wheezes. He has no rales.  Abdominal: Soft. He exhibits no distension. There is no tenderness. There is no rebound and no guarding.  Lymphadenopathy:    He has cervical adenopathy.  Neurological: He is alert and oriented to person, place, and time.  Skin: He is not diaphoretic.      No results found for this or any previous visit (from the past 48 hour(s)). No results found.  Assessment/Plan:  Patient with probable diverticulitis I discussed with him the fact that he is currently asymptomatic and started a new job and the fact that he canceled multiple surgeries in the past we would not schedule surgery at this time and he is happy with that decision because he does not want to compromise his new job he will follow-up with [place] on an as-needed basis  Lattie Haw, MD, FACS

## 2017-03-28 ENCOUNTER — Encounter: Payer: Self-pay | Admitting: *Deleted

## 2017-03-28 ENCOUNTER — Emergency Department
Admission: EM | Admit: 2017-03-28 | Discharge: 2017-03-29 | Disposition: A | Discharge status: Home / Self Care | Payer: Medicaid Other | Attending: Emergency Medicine | Admitting: Emergency Medicine

## 2017-03-28 DIAGNOSIS — R1030 Lower abdominal pain, unspecified: Secondary | ICD-10-CM

## 2017-03-28 DIAGNOSIS — K5720 Diverticulitis of large intestine with perforation and abscess without bleeding: Secondary | ICD-10-CM | POA: Insufficient documentation

## 2017-03-28 DIAGNOSIS — K589 Irritable bowel syndrome without diarrhea: Secondary | ICD-10-CM | POA: Insufficient documentation

## 2017-03-28 DIAGNOSIS — F17210 Nicotine dependence, cigarettes, uncomplicated: Secondary | ICD-10-CM | POA: Insufficient documentation

## 2017-03-28 LAB — CBC
HCT: 46.4 % (ref 40.0–52.0)
Hemoglobin: 16 g/dL (ref 13.0–18.0)
MCH: 30.3 pg (ref 26.0–34.0)
MCHC: 34.5 g/dL (ref 32.0–36.0)
MCV: 87.8 fL (ref 80.0–100.0)
Platelets: 214 10*3/uL (ref 150–440)
RBC: 5.28 MIL/uL (ref 4.40–5.90)
RDW: 14.1 % (ref 11.5–14.5)
WBC: 8.8 10*3/uL (ref 3.8–10.6)

## 2017-03-28 LAB — COMPREHENSIVE METABOLIC PANEL
ALBUMIN: 3.8 g/dL (ref 3.5–5.0)
ALK PHOS: 48 U/L (ref 38–126)
ALT: 13 U/L — AB (ref 17–63)
AST: 18 U/L (ref 15–41)
Anion gap: 6 (ref 5–15)
BILIRUBIN TOTAL: 0.3 mg/dL (ref 0.3–1.2)
BUN: 12 mg/dL (ref 6–20)
CALCIUM: 8.8 mg/dL — AB (ref 8.9–10.3)
CO2: 27 mmol/L (ref 22–32)
Chloride: 108 mmol/L (ref 101–111)
Creatinine, Ser: 1.69 mg/dL — ABNORMAL HIGH (ref 0.61–1.24)
GFR calc Af Amer: 59 mL/min — ABNORMAL LOW (ref >60)
GFR calc non Af Amer: 51 mL/min — ABNORMAL LOW (ref >60)
GLUCOSE: 90 mg/dL (ref 65–99)
Potassium: 4.3 mmol/L (ref 3.5–5.1)
Sodium: 141 mmol/L (ref 135–145)
TOTAL PROTEIN: 6.7 g/dL (ref 6.5–8.1)

## 2017-03-28 LAB — URINALYSIS, COMPLETE (UACMP) WITH MICROSCOPIC
BACTERIA UA: NONE SEEN
Bilirubin Urine: NEGATIVE
GLUCOSE, UA: NEGATIVE mg/dL
Ketones, ur: NEGATIVE mg/dL
NITRITE: NEGATIVE
Protein, ur: NEGATIVE mg/dL
SPECIFIC GRAVITY, URINE: 1.02 (ref 1.005–1.030)
pH: 5 (ref 5.0–8.0)

## 2017-03-28 LAB — LIPASE, BLOOD: Lipase: 28 U/L (ref 11–51)

## 2017-03-28 NOTE — ED Triage Notes (Signed)
Pt has abd pain for 1 day.  No v/d.  Pt has hx diverticulitis.  Pt reports lower back pain.  Denies urinary sx.

## 2017-03-29 NOTE — ED Notes (Signed)
Pt presents to ED with lower abd pain since this morning. Pt states he is currently feeling much better and asking for a work note. Denies pain, nausea, or diarrhea.

## 2017-03-29 NOTE — Discharge Instructions (Signed)
Return to the ER for symptoms of concern.

## 2017-03-29 NOTE — ED Provider Notes (Signed)
[facility] Emergency Department Provider Note  ____________________________________________   None    (approximate)  I have reviewed the triage vital signs and the nursing notes.   HISTORY  Chief Complaint Abdominal Pain   HPI Adam Pugh is a 36 y.o. male who presents to the emergency department for evaluation of abdominal pain. While in the waiting room, he states that his symptoms have resolved and is requesting discharge. He states that he came in because he was afraid he was having another flare of diverticulitis. At this time, he is completely pain and nausea free.    Past Medical History:  Diagnosis Date  . Diverticulitis of large intestine with perforation and abscess without bleeding   . Inflammatory bowel disease (Crohn's disease) [facility])     Patient Active Problem List   Diagnosis Date Noted  . Tobacco abuse 09/10/2015  . Renal insufficiency 09/10/2015  . Diverticulitis of large intestine with perforation and abscess without bleeding   . 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 Leukocytosis 09/08/2015  . Sepsis (HCC) 09/08/2015  . IBD (inflammatory bowel disease) 09/08/2015  . Hematuria 09/08/2015  . Lower abdominal pain     Past Surgical History:  Procedure Laterality Date  . NO PAST SURGERIES      Prior to Admission medications   Not on File    Allergies Patient has no known allergies.  Family History  Problem Relation Age of Onset  . Glaucoma Father   . Diabetes Other     Social History Social History  Substance Use Topics  . Smoking status: Current Some Day Smoker    Packs/day: 0.25    Types: Cigarettes  . Smokeless tobacco: Never Used  . Alcohol use No    Review of Systems  Constitutional: No fever/chills Eyes: No visual changes. ENT: No sore throat. Cardiovascular: Denies chest pain. Respiratory: Denies shortness of  breath. Gastrointestinal: No abdominal pain.  No nausea, no vomiting.  No diarrhea.  No constipation. Genitourinary: Negative for dysuria. Musculoskeletal: Negative for back pain. Skin: Negative for rash. Neurological: Negative for headaches, focal weakness or numbness. ____________________________________________   PHYSICAL EXAM:  VITAL SIGNS: ED Triage Vitals  Enc Vitals Group     BP 03/28/17 2135 115/70     Pulse Rate 03/28/17 2134 (!) 50     Resp 03/28/17 2134 20     Temp 03/28/17 2134 99.3 F (37.4 C)     Temp Source 03/28/17 2134 Oral     SpO2 03/28/17 2134 98 %     Weight 03/28/17 2135 195 lb (88.5 kg)     Height 03/28/17 2135 6\' 2"  (1.88 m)     Head Circumference --      Peak Flow --      Pain Score 03/28/17 2134 8     Pain Loc --      Pain Edu? --      Excl. in GC? --     Constitutional: Alert and oriented. Well appearing and in no acute distress. Eyes: Conjunctivae are normal.  Head: Atraumatic. Nose: No congestion/rhinnorhea. Mouth/Throat: Mucous membranes are moist.  Oropharynx non-erythematous. Neck: No stridor.   Cardiovascular: Normal rate, regular rhythm. Grossly normal heart sounds.  Good peripheral circulation. Respiratory: Normal respiratory effort.  No retractions. Lungs CTAB. Gastrointestinal: Soft and nontender. No distention. No abdominal bruits. No CVA tenderness. Musculoskeletal: No lower extremity tenderness nor edema.  No joint effusions. Neurologic:  Normal speech  and language. No gross focal neurologic deficits are appreciated. No gait instability. Skin:  Skin is warm, dry and intact. No rash noted. Psychiatric: Mood and affect are normal. Speech and behavior are normal.  ____________________________________________   LABS (all labs ordered are listed, but only abnormal results are displayed)  Labs Reviewed  COMPREHENSIVE METABOLIC PANEL - Abnormal; Notable for the following:       Result Value   Creatinine, Ser 1.69 (*)    Calcium  8.8 (*)    ALT 13 (*)    GFR calc non Af Amer 51 (*)    GFR calc Af Amer 59 (*)    All other components within normal limits  URINALYSIS, COMPLETE (UACMP) WITH MICROSCOPIC - Abnormal; Notable for the following:    Color, Urine YELLOW (*)    APPearance CLEAR (*)    Hgb urine dipstick SMALL (*)    Leukocytes, UA SMALL (*)    Squamous Epithelial / LPF 0-5 (*)    All other components within normal limits  LIPASE, BLOOD  CBC   ____________________________________________  EKG  Not indicated. ____________________________________________  RADIOLOGY  No results found.  ____________________________________________   PROCEDURES  Procedure(s) performed: None  Procedures  Critical Care performed: No  ____________________________________________   INITIAL IMPRESSION / ASSESSMENT AND PLAN / ED COURSE  Pertinent labs & imaging results that were available during my care of the patient were reviewed by me and considered in my medical decision making (see chart for details).  36 year old male with history of diverticulitis who presented for evaluation of abdominal pain that has since resolved. He will be discharged home per his request. He is to return to the ER for symptoms that change or worsen if unable to see his PCP.      ____________________________________________   FINAL CLINICAL IMPRESSION(S) / ED DIAGNOSES  Final diagnoses:  Lower abdominal pain      NEW MEDICATIONS STARTED DURING THIS VISIT:  There are no discharge medications for this patient.    Note:  This document was prepared using Dragon voice recognition software and may include unintentional dictation errors.    Chinita Pester, FNP 03/30/17 1600    Darci Current, MD 03/30/17 [phone]

## 2017-04-25 ENCOUNTER — Emergency Department
Admission: EM | Admit: 2017-04-25 | Discharge: 2017-04-25 | Disposition: A | Discharge status: Home / Self Care | Payer: Medicaid Other | Attending: Emergency Medicine | Admitting: Emergency Medicine

## 2017-04-25 ENCOUNTER — Encounter: Payer: Self-pay | Admitting: Emergency Medicine

## 2017-04-25 DIAGNOSIS — R5383 Other fatigue: Secondary | ICD-10-CM | POA: Insufficient documentation

## 2017-04-25 DIAGNOSIS — R6883 Chills (without fever): Secondary | ICD-10-CM

## 2017-04-25 DIAGNOSIS — F17210 Nicotine dependence, cigarettes, uncomplicated: Secondary | ICD-10-CM | POA: Insufficient documentation

## 2017-04-25 HISTORY — DX: Cocaine abuse, in remission: F14.11

## 2017-04-25 HISTORY — DX: Patient's noncompliance with other medical treatment and regimen due to unspecified reason: Z91.199

## 2017-04-25 HISTORY — DX: Patient's noncompliance with other medical treatment and regimen: Z91.19

## 2017-04-25 LAB — CBC WITH DIFFERENTIAL/PLATELET
Basophils Absolute: 0 10*3/uL (ref 0–0.1)
Basophils Relative: 0 %
EOS PCT: 0 %
Eosinophils Absolute: 0.1 10*3/uL (ref 0–0.7)
HCT: 43.3 % (ref 40.0–52.0)
Hemoglobin: 14.7 g/dL (ref 13.0–18.0)
LYMPHS PCT: 6 %
Lymphs Abs: 0.9 10*3/uL — ABNORMAL LOW (ref 1.0–3.6)
MCH: 30.1 pg (ref 26.0–34.0)
MCHC: 33.9 g/dL (ref 32.0–36.0)
MCV: 88.8 fL (ref 80.0–100.0)
MONO ABS: 0.5 10*3/uL (ref 0.2–1.0)
Monocytes Relative: 3 %
Neutro Abs: 14.1 10*3/uL — ABNORMAL HIGH (ref 1.4–6.5)
Neutrophils Relative %: 91 %
PLATELETS: 226 10*3/uL (ref 150–440)
RBC: 4.88 MIL/uL (ref 4.40–5.90)
RDW: 13.6 % (ref 11.5–14.5)
WBC: 15.6 10*3/uL — ABNORMAL HIGH (ref 3.8–10.6)

## 2017-04-25 LAB — COMPREHENSIVE METABOLIC PANEL
ALT: 12 U/L — ABNORMAL LOW (ref 17–63)
ANION GAP: 6 (ref 5–15)
AST: 21 U/L (ref 15–41)
Albumin: 3.7 g/dL (ref 3.5–5.0)
Alkaline Phosphatase: 36 U/L — ABNORMAL LOW (ref 38–126)
BUN: 12 mg/dL (ref 6–20)
CHLORIDE: 105 mmol/L (ref 101–111)
CO2: 29 mmol/L (ref 22–32)
Calcium: 9.1 mg/dL (ref 8.9–10.3)
Creatinine, Ser: 1.01 mg/dL (ref 0.61–1.24)
Glucose, Bld: 108 mg/dL — ABNORMAL HIGH (ref 65–99)
POTASSIUM: 4.2 mmol/L (ref 3.5–5.1)
Sodium: 140 mmol/L (ref 135–145)
Total Bilirubin: 0.4 mg/dL (ref 0.3–1.2)
Total Protein: 6.5 g/dL (ref 6.5–8.1)

## 2017-04-25 LAB — URINALYSIS, COMPLETE (UACMP) WITH MICROSCOPIC
BILIRUBIN URINE: NEGATIVE
Bacteria, UA: NONE SEEN
GLUCOSE, UA: NEGATIVE mg/dL
KETONES UR: NEGATIVE mg/dL
LEUKOCYTES UA: NEGATIVE
NITRITE: NEGATIVE
PH: 7 (ref 5.0–8.0)
PROTEIN: NEGATIVE mg/dL
Specific Gravity, Urine: 1.013 (ref 1.005–1.030)
Squamous Epithelial / LPF: NONE SEEN
WBC UA: NONE SEEN WBC/hpf (ref 0–5)

## 2017-04-25 NOTE — ED Triage Notes (Signed)
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 States feels weak. Denies any pain. Denies cough.

## 2017-04-25 NOTE — ED Provider Notes (Signed)
[facility] Emergency Department Provider Note  ____________________________________________   First MD Initiated Contact with Patient 04/25/17 1518     (approximate)  I have reviewed the triage vital signs and the nursing notes.   HISTORY  Chief Complaint Chills and Weakness    HPI Bryann D Handler is a 36 y.o. male with questionable history of inflammatory bowel disease which may or may not include diverticulitis as well who presents today from work for evaluation of chills.  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  He states he is more tired than usual and has been working hard, with 10+ hours days.  He denies congestion/runny nose, sore throat, neck pain, fever, chest pain, shortness of breath, abdominal pain, nausea/vomiting/diarrhea, dysuria.  He has had no focal numbness or weakness in any of his extremities.  He describes the chills as severe.    Nothing in particular makes the patient's symptoms better nor worse.  When I checked on him he was sleeping in the exam room and states he feels a little better after getting some rest.   Past Medical History:  Diagnosis Date  . Diverticulitis of large intestine with perforation and abscess without bleeding   . History of cocaine abuse   . Medical non-compliance    multiple notes from surgery commenting on patient not following up in clinic nor returning phone calls    Patient Active Problem List   Diagnosis Date Noted  . Tobacco abuse 09/10/2015  . Renal insufficiency 09/10/2015  . Diverticulitis of large intestine with perforation and abscess without bleeding   . 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 Leukocytosis 09/08/2015  . Sepsis (HCC) 09/08/2015  . IBD (inflammatory bowel disease) 09/08/2015  . Hematuria 09/08/2015  . Lower abdominal  pain     Past Surgical History:  Procedure Laterality Date  . NO PAST SURGERIES      Prior to Admission medications   Not on File    Allergies Patient has no known allergies.  Family History  Problem Relation Age of Onset  . Glaucoma Father   . Diabetes Other     Social History Social History  Substance Use Topics  . Smoking status: Current Some Day Smoker    Packs/day: 0.50    Types: Cigarettes  . Smokeless tobacco: Never Used  . Alcohol use No    Review of Systems Constitutional: chills, no fever. general malaise.  Fatigue. Eyes: No visual changes. ENT: No sore throat. Cardiovascular: Denies chest pain. Respiratory: Denies shortness of breath. Gastrointestinal: No abdominal pain.  No nausea, no vomiting.  No diarrhea.  No constipation. Genitourinary: Negative for dysuria. Musculoskeletal: Negative for neck pain.  Negative for back pain. Integumentary: Negative for rash. Neurological: Negative for headaches, focal weakness or numbness.   ____________________________________________   PHYSICAL EXAM:  VITAL SIGNS: ED Triage Vitals [04/25/17 1159]  Enc Vitals Group     BP (!) 144/89     Pulse Rate 78     Resp 20     Temp 99.3 F (37.4 C)     Temp Source Oral     SpO2 100 %     Weight 88.5 kg (195 lb)     Height 1.88 m (6\' 2" )     Head Circumference      Peak Flow  Pain Score      Pain Loc      Pain Edu?      Excl. in GC?     Constitutional: Alert and oriented. Well appearing and in no acute distress. Eyes: Conjunctivae are normal.  Head: Atraumatic. Nose: No congestion/rhinnorhea. Mouth/Throat: Mucous membranes are moist.  Oropharynx non-erythematous. Neck: No stridor.  No meningeal signs.  no cervical lymphadenopathy Cardiovascular: Normal rate, regular rhythm. Good peripheral circulation. Grossly normal heart sounds. Respiratory: Normal respiratory effort.  No retractions. Lungs CTAB. Gastrointestinal: Soft and nontender. No distention.   Musculoskeletal: No lower extremity tenderness nor edema. No gross deformities of extremities. Neurologic:  Normal speech and language. No gross focal neurologic deficits are appreciated.  Skin:  Skin is warm, dry and intact. No rash noted. Psychiatric: Mood and affect are normal. Speech and behavior are normal.  ____________________________________________   LABS (all labs ordered are listed, but only abnormal results are displayed)  Labs Reviewed  CBC WITH DIFFERENTIAL/PLATELET - Abnormal; Notable for the following:       Result Value   WBC 15.6 (*)    Neutro Abs 14.1 (*)    Lymphs Abs 0.9 (*)    All other components within normal limits  COMPREHENSIVE METABOLIC PANEL - Abnormal; Notable for the following:    Glucose, Bld 108 (*)    ALT 12 (*)    Alkaline Phosphatase 36 (*)    All other components within normal limits  URINALYSIS, COMPLETE (UACMP) WITH MICROSCOPIC - Abnormal; Notable for the following:    Color, Urine YELLOW (*)    APPearance CLEAR (*)    Hgb urine dipstick SMALL (*)    All other components within normal limits  CULTURE, BLOOD (ROUTINE X 2)  CULTURE, BLOOD (ROUTINE X 2)   ____________________________________________  EKG  None - EKG not ordered by ED physician ____________________________________________  RADIOLOGY   No results found.  ____________________________________________   PROCEDURES  Critical Care performed: No   Procedure(s) performed:   Procedures   ____________________________________________   INITIAL IMPRESSION / ASSESSMENT AND PLAN / ED COURSE  Pertinent labs & imaging results that were available during my care of the patient were reviewed by me and considered in my medical decision making (see chart for details).  the patient has a mild leukocytosis but normal vital signs and he is very well-appearing.  I have no specific cause or reason for his symptoms of chills and fatigue/malaise.  At this point he looks well,  has a reassuring physical exam, and states he feels a little bit better after resting.  I suggested we give him a work note for today but explained that I had no specific cause for his symptoms, possibly viral, but generally reassuring.  He is comfortable with the plan.  I gave my usual and customary return precautions.      Clinical Course as of Apr 25 1614  Mon Apr 25, 2017  1534 given the leukocytosis and the chills, I will send blood cultures prior to his discharge even though I think it very unlikely that he has developed bacteremia.  It is always possible that he has had an exposure or used IV drugs and is not disclosing, however, so I will not treat with antibiotics but we will send a culture for future review.  [CF]    Clinical Course User Index [CF] Loleta Rose, MD    ____________________________________________  FINAL CLINICAL IMPRESSION(S) / ED DIAGNOSES  Final diagnoses:  Chills (without fever)  Fatigue, unspecified type  MEDICATIONS GIVEN DURING THIS VISIT:  Medications - No data to display   NEW OUTPATIENT MEDICATIONS STARTED DURING THIS VISIT:  New Prescriptions   No medications on file    Modified Medications   No medications on file    Discontinued Medications   No medications on file     Note:  This document was prepared using Dragon voice recognition software and may include unintentional dictation errors.    Loleta Rose, MD 04/25/17 [phone]

## 2017-04-25 NOTE — ED Notes (Signed)
Patient is complaining of generalized weakness and chills.  No obvious distress at this time.

## 2017-04-25 NOTE — Discharge Instructions (Signed)
Your workup in the Emergency Department today was reassuring.  We did not find any specific abnormalities.  We recommend you drink plenty of fluids, take your regular medications and/or any new ones prescribed today, and follow up with the doctor(s) listed in these documents as recommended.  Note that we sent some blood tests and the results usually do not come back for a couple of days (blood cultures).  If they come back concerning for infection, someone will contact you.    Return to the Emergency Department if you develop new or worsening symptoms that concern you.

## 2017-04-30 LAB — CULTURE, BLOOD (ROUTINE X 2)
CULTURE: NO GROWTH
CULTURE: NO GROWTH
Special Requests: ADEQUATE

## 2018-10-16 ENCOUNTER — Emergency Department: Payer: Self-pay

## 2018-10-16 ENCOUNTER — Encounter: Payer: Self-pay | Admitting: Emergency Medicine

## 2018-10-16 ENCOUNTER — Emergency Department
Admission: EM | Admit: 2018-10-16 | Discharge: 2018-10-16 | Disposition: A | Discharge status: Home / Self Care | Payer: Self-pay | Attending: Emergency Medicine | Admitting: Emergency Medicine

## 2018-10-16 DIAGNOSIS — K529 Noninfective gastroenteritis and colitis, unspecified: Secondary | ICD-10-CM | POA: Insufficient documentation

## 2018-10-16 DIAGNOSIS — F17210 Nicotine dependence, cigarettes, uncomplicated: Secondary | ICD-10-CM | POA: Insufficient documentation

## 2018-10-16 LAB — URINALYSIS, COMPLETE (UACMP) WITH MICROSCOPIC
Bacteria, UA: NONE SEEN
Bilirubin Urine: NEGATIVE
Glucose, UA: NEGATIVE mg/dL
Ketones, ur: NEGATIVE mg/dL
Leukocytes,Ua: NEGATIVE
Nitrite: NEGATIVE
Protein, ur: NEGATIVE mg/dL
Specific Gravity, Urine: 1.018 (ref 1.005–1.030)
pH: 8 (ref 5.0–8.0)

## 2018-10-16 LAB — COMPREHENSIVE METABOLIC PANEL
ALT: 10 U/L (ref 0–44)
AST: 16 U/L (ref 15–41)
Albumin: 3.9 g/dL (ref 3.5–5.0)
Alkaline Phosphatase: 39 U/L (ref 38–126)
Anion gap: 5 (ref 5–15)
BUN: 10 mg/dL (ref 6–20)
CO2: 31 mmol/L (ref 22–32)
CREATININE: 1.07 mg/dL (ref 0.61–1.24)
Calcium: 9.3 mg/dL (ref 8.9–10.3)
Chloride: 103 mmol/L (ref 98–111)
GFR calc Af Amer: >60 mL/min (ref >60)
GFR calc non Af Amer: >60 mL/min (ref >60)
Glucose, Bld: 112 mg/dL — ABNORMAL HIGH (ref 70–99)
Potassium: 4.3 mmol/L (ref 3.5–5.1)
Sodium: 139 mmol/L (ref 135–145)
Total Bilirubin: 0.7 mg/dL (ref 0.3–1.2)
Total Protein: 7.2 g/dL (ref 6.5–8.1)

## 2018-10-16 LAB — CBC
HCT: 46.3 % (ref 39.0–52.0)
Hemoglobin: 15.2 g/dL (ref 13.0–17.0)
MCH: 28.6 pg (ref 26.0–34.0)
MCHC: 32.8 g/dL (ref 30.0–36.0)
MCV: 87 fL (ref 80.0–100.0)
NRBC: 0 % (ref 0.0–0.2)
Platelets: 293 10*3/uL (ref 150–400)
RBC: 5.32 MIL/uL (ref 4.22–5.81)
RDW: 12.8 % (ref 11.5–15.5)
WBC: 14.2 10*3/uL — ABNORMAL HIGH (ref 4.0–10.5)

## 2018-10-16 LAB — LIPASE, BLOOD: Lipase: 25 U/L (ref 11–51)

## 2018-10-16 LAB — TROPONIN I: Troponin I: <0.03 ng/mL (ref <0.03)

## 2018-10-16 MED ORDER — FAMOTIDINE 20 MG PO TABS: 20.0000 mg | ORAL_TABLET | Freq: Two times a day (BID) | ORAL | 0 refills | Status: DC

## 2018-10-16 MED ORDER — IOHEXOL 300 MG/ML  SOLN
100.0000 mL | Freq: Once | INTRAMUSCULAR | Status: AC | PRN
  Administered 2018-10-16: 100 mL via INTRAVENOUS

## 2018-10-16 MED ORDER — KETOROLAC TROMETHAMINE 30 MG/ML IJ SOLN
30.0000 mg | Freq: Once | INTRAMUSCULAR | Status: AC
  Administered 2018-10-16: 30 mg via INTRAVENOUS
  Filled 2018-10-16: qty 1

## 2018-10-16 MED ORDER — ONDANSETRON HCL 4 MG/2ML IJ SOLN
4.0000 mg | Freq: Once | INTRAMUSCULAR | Status: AC
  Administered 2018-10-16: 4 mg via INTRAVENOUS
  Filled 2018-10-16: qty 2

## 2018-10-16 MED ORDER — DICYCLOMINE HCL 10 MG PO CAPS: 10.0000 mg | ORAL_CAPSULE | Freq: Three times a day (TID) | ORAL | 0 refills | Status: DC | PRN

## 2018-10-16 MED ORDER — HYDROMORPHONE HCL 1 MG/ML IJ SOLN
1.0000 mg | Freq: Once | INTRAMUSCULAR | Status: AC
  Administered 2018-10-16: 1 mg via INTRAVENOUS
  Filled 2018-10-16: qty 1

## 2018-10-16 MED ORDER — ONDANSETRON 8 MG PO TBDP: 8.0000 mg | ORAL_TABLET | Freq: Three times a day (TID) | ORAL | 0 refills | Status: DC | PRN

## 2018-10-16 MED ORDER — SODIUM CHLORIDE 0.9 % IV BOLUS
1000.0000 mL | Freq: Once | INTRAVENOUS | Status: AC
  Administered 2018-10-16: 1000 mL via INTRAVENOUS

## 2018-10-16 MED ORDER — DICYCLOMINE HCL 10 MG/ML IM SOLN
10.0000 mg | Freq: Once | INTRAMUSCULAR | Status: AC
  Administered 2018-10-16: 10 mg via INTRAMUSCULAR
  Filled 2018-10-16: qty 1

## 2018-10-16 NOTE — ED Triage Notes (Signed)
Patient with complaint of epigastric pain that started this morning. Patient states that he has vomited times two day. Patient states that he has not had a BM in 2 days and that he took stool softener and laxative today with no results.

## 2018-10-16 NOTE — ED Notes (Signed)
Pt in ct 

## 2018-10-16 NOTE — ED Provider Notes (Addendum)
[facility] Emergency Department Provider Note ____________________________________________   First MD Initiated Contact with Patient 10/16/18 0246     (approximate)  I have reviewed the triage vital signs and the nursing notes.   HISTORY  Chief Complaint Abdominal Pain    HPI Adam Pugh is a 38 y.o. male with PMH as noted below including history of diverticulitis and possible inflammatory bowel disease who presents with abdominal pain over the last day, gradual onset, worsening, and associated with vomiting.  It is mainly in the upper abdomen but the patient states that now goes down to his central lower abdomen as well.  He states it feels similar to when he had diverticulitis.  The patient states that he has been constipated over the last 2 days.  He took a laxative without any result.  Past Medical History:  Diagnosis Date  . Diverticulitis of large intestine with perforation and abscess without bleeding   . History of cocaine abuse (HCC)   . Medical non-compliance    multiple notes from surgery commenting on patient not following up in clinic nor returning phone calls    Patient Active Problem List   Diagnosis Date Noted  . Tobacco abuse 09/10/2015  . Renal insufficiency 09/10/2015  . Diverticulitis of large intestine with perforation and abscess without bleeding   . 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 Leukocytosis 09/08/2015  . Sepsis (HCC) 09/08/2015  . IBD (inflammatory bowel disease) 09/08/2015  . Hematuria 09/08/2015  . Lower abdominal pain     Past Surgical History:  Procedure Laterality Date  . NO PAST SURGERIES      Prior to Admission medications   Medication Sig Start Date End Date Taking? Authorizing Provider  dicyclomine (BENTYL) 10 MG capsule Take 1 capsule (10 mg total) by mouth 3 (three) times daily as needed for up to 5 days for spasms. 10/16/18  10/21/18  Dionne Bucy, MD  famotidine (PEPCID) 20 MG tablet Take 1 tablet (20 mg total) by mouth 2 (two) times daily for 15 days. 10/16/18 10/31/18  Dionne Bucy, MD  ondansetron (ZOFRAN ODT) 8 MG disintegrating tablet Take 1 tablet (8 mg total) by mouth every 8 (eight) hours as needed for nausea or vomiting. 10/16/18   Dionne Bucy, MD    Allergies Patient has no known allergies.  Family History  Problem Relation Age of Onset  . Glaucoma Father   . Diabetes Other     Social History Social History   Tobacco Use  . Smoking status: Current Some Day Smoker    Packs/day: 0.50    Types: Cigarettes  . Smokeless tobacco: Never Used  Substance Use Topics  . Alcohol use: Yes    Alcohol/week: 0.0 standard drinks    Comment: occ  . Drug use: Not Currently    Types: Cocaine, Marijuana    Comment: LAST 1 MONTH AGO)    Review of Systems  Constitutional: No fever. Eyes: No redness. ENT: No sore throat. Cardiovascular: Denies chest pain. Respiratory: Denies shortness of breath. Gastrointestinal: Positive for vomiting. Genitourinary: Negative for dysuria.  Musculoskeletal: Negative for back pain. Skin: Negative for rash. Neurological: Negative for headache.  ____________________________________________   PHYSICAL EXAM:  VITAL SIGNS: ED Triage Vitals [10/16/18 0049]  Enc Vitals Group     BP      Pulse      Resp      Temp      Temp src  SpO2      Weight 211 lb (95.7 kg)     Height 6\' 2"  (1.88 m)     Head Circumference      Peak Flow      Pain Score 10     Pain Loc      Pain Edu?      Excl. in [place]?     Constitutional: Alert and oriented.  Uncomfortable appearing but in no acute distress. Eyes: Conjunctivae are normal.  No scleral icterus. Head: Atraumatic. Nose: No congestion/rhinnorhea. Mouth/Throat: Mucous membranes are moist.   Neck: Normal range of motion.  Cardiovascular: Good peripheral circulation. Respiratory: Normal respiratory  effort.  No retractions.  Gastrointestinal: Soft with mild epigastric tenderness.  No distention.  Genitourinary: No flank tenderness. Musculoskeletal: Extremities warm and well perfused.  Neurologic:  Normal speech and language. No gross focal neurologic deficits are appreciated.  Skin:  Skin is warm and dry. No rash noted. Psychiatric: Mood and affect are normal. Speech and behavior are normal.  ____________________________________________   LABS (all labs ordered are listed, but only abnormal results are displayed)  Labs Reviewed  COMPREHENSIVE METABOLIC PANEL - Abnormal; Notable for the following components:      Result Value   Glucose, Bld 112 (*)    All other components within normal limits  CBC - Abnormal; Notable for the following components:   WBC 14.2 (*)    All other components within normal limits  URINALYSIS, COMPLETE (UACMP) WITH MICROSCOPIC - Abnormal; Notable for the following components:   Color, Urine YELLOW (*)    APPearance CLEAR (*)    Hgb urine dipstick SMALL (*)    All other components within normal limits  LIPASE, BLOOD  TROPONIN I   ____________________________________________  EKG  ED ECG REPORT I, Dionne Bucy, the attending physician, personally viewed and interpreted this ECG.  Date: 10/16/2018 EKG Time: 0049 Rate: 76 Rhythm: normal sinus rhythm QRS Axis: normal Intervals: normal ST/T Wave abnormalities: normal Narrative Interpretation: no evidence of acute ischemia ____________________________________________  RADIOLOGY  CT abdomen: Small bowel inflammation consistent with enteritis  ____________________________________________   PROCEDURES  Procedure(s) performed: No  Procedures  Critical Care performed: No ____________________________________________   INITIAL IMPRESSION / ASSESSMENT AND PLAN / ED COURSE  Pertinent labs & imaging results that were available during my care of the patient were reviewed by me and  considered in my medical decision making (see chart for details).  38 year old male with PMH as noted above presents with mainly epigastric abdominal pain although it is somewhat diffuse, and is associated with vomiting.  The patient denies fever or urinary symptoms.  He states that he has been constipated, and states that he feels similar to when he had a flare of diverticulitis.  I reviewed the past medical records in Epic.  The patient has history of possible inflammatory bowel disease versus diverticulitis and has CT findings in the past showing diverticulitis.  On exam today he is relatively well-appearing.  The vital signs are normal.  His abdomen is soft with some epigastric tenderness but no other focal tenderness although he does have discomfort to bilateral lower quadrants.  The remainder of the exam is as described above.  Given the location of the pain, differential includes gastritis, PUD, acute gastroenteritis, pancreatitis, or other hepatobiliary cause.  However given his prior history of IBD and/or diverticulitis, and his report that the symptoms are similar, this is also quite possible.  We will obtain lab work-up and CT abdomen for the initial  evaluation.  I will give fluids and analgesia and reassess.  ----------------------------------------- 5:26 AM on 10/16/2018 -----------------------------------------  Lab work-up is reassuring except for slightly elevated white count.  The CT shows small bowel findings consistent with enteritis, without other acute abnormalities.  On reassessment, the patient states that he still has some pain although it is improved he appears quite comfortable.  I counseled him on the results of the work-up.  At this time he is stable for discharge home.  I ordered Bentyl and Toradol for some additional analgesia.  I will prescribe Zofran, Bentyl, and Pepcid.  Return precautions given, and the patient expressed  understanding. ____________________________________________   FINAL CLINICAL IMPRESSION(S) / ED DIAGNOSES  Final diagnoses:  Enteritis      NEW MEDICATIONS STARTED DURING THIS VISIT:  Discharge Medication List as of 10/16/2018  5:25 AM    START taking these medications   Details  dicyclomine (BENTYL) 10 MG capsule Take 1 capsule (10 mg total) by mouth 3 (three) times daily as needed for up to 5 days for spasms., Starting Mon 10/16/2018, Until Sat 10/21/2018, Normal    famotidine (PEPCID) 20 MG tablet Take 1 tablet (20 mg total) by mouth 2 (two) times daily for 15 days., Starting Mon 10/16/2018, Until Tue 10/31/2018, Normal    ondansetron (ZOFRAN ODT) 8 MG disintegrating tablet Take 1 tablet (8 mg total) by mouth every 8 (eight) hours as needed for nausea or vomiting., Starting Mon 10/16/2018, Normal         Note:  This document was prepared using Dragon voice recognition software and may include unintentional dictation errors.    Dionne Bucy, MD 10/16/18 7680    Dionne Bucy, MD 11/08/18 1119

## 2018-10-16 NOTE — Discharge Instructions (Addendum)
Your CT shows inflammation in your small intestine which is usually from viral infections.  This will usually resolve on its own.  Return to the ER for new, worsening, or persistent severe abdominal pain, vomiting, fever, weakness, or any other new or worsening symptoms that concern you.

## 2018-10-16 NOTE — ED Notes (Signed)
Pt's visitor states pt needs additional pain medication. Pt is currently asleep.

## 2019-07-15 ENCOUNTER — Encounter: Payer: Self-pay | Admitting: *Deleted

## 2019-07-15 ENCOUNTER — Other Ambulatory Visit: Payer: Self-pay

## 2019-07-15 ENCOUNTER — Emergency Department
Admission: EM | Admit: 2019-07-15 | Discharge: 2019-07-15 | Disposition: A | Discharge status: Home / Self Care | Payer: Self-pay | Attending: Emergency Medicine | Admitting: Emergency Medicine

## 2019-07-15 DIAGNOSIS — Z5321 Procedure and treatment not carried out due to patient leaving prior to being seen by health care provider: Secondary | ICD-10-CM | POA: Insufficient documentation

## 2019-07-15 DIAGNOSIS — R110 Nausea: Secondary | ICD-10-CM | POA: Insufficient documentation

## 2019-07-15 LAB — URINALYSIS, COMPLETE (UACMP) WITH MICROSCOPIC
Bacteria, UA: NONE SEEN
Bilirubin Urine: NEGATIVE
Glucose, UA: NEGATIVE mg/dL
Ketones, ur: NEGATIVE mg/dL
Leukocytes,Ua: NEGATIVE
Nitrite: NEGATIVE
Protein, ur: NEGATIVE mg/dL
Specific Gravity, Urine: 1.027 (ref 1.005–1.030)
pH: 5 (ref 5.0–8.0)

## 2019-07-15 LAB — CBC
HCT: 39.2 % (ref 39.0–52.0)
Hemoglobin: 12.6 g/dL — ABNORMAL LOW (ref 13.0–17.0)
MCH: 27.9 pg (ref 26.0–34.0)
MCHC: 32.1 g/dL (ref 30.0–36.0)
MCV: 86.7 fL (ref 80.0–100.0)
Platelets: 238 10*3/uL (ref 150–400)
RBC: 4.52 MIL/uL (ref 4.22–5.81)
RDW: 13.1 % (ref 11.5–15.5)
WBC: 8.9 10*3/uL (ref 4.0–10.5)
nRBC: 0 % (ref 0.0–0.2)

## 2019-07-15 LAB — COMPREHENSIVE METABOLIC PANEL
ALT: 12 U/L (ref 0–44)
AST: 18 U/L (ref 15–41)
Albumin: 3.8 g/dL (ref 3.5–5.0)
Alkaline Phosphatase: 40 U/L (ref 38–126)
Anion gap: 6 (ref 5–15)
BUN: 13 mg/dL (ref 6–20)
CO2: 26 mmol/L (ref 22–32)
Calcium: 8.5 mg/dL — ABNORMAL LOW (ref 8.9–10.3)
Chloride: 105 mmol/L (ref 98–111)
Creatinine, Ser: 1.18 mg/dL (ref 0.61–1.24)
GFR calc Af Amer: >60 mL/min (ref >60)
GFR calc non Af Amer: >60 mL/min (ref >60)
Glucose, Bld: 101 mg/dL — ABNORMAL HIGH (ref 70–99)
Potassium: 3.5 mmol/L (ref 3.5–5.1)
Sodium: 137 mmol/L (ref 135–145)
Total Bilirubin: 0.5 mg/dL (ref 0.3–1.2)
Total Protein: 6.7 g/dL (ref 6.5–8.1)

## 2019-07-15 LAB — LIPASE, BLOOD: Lipase: 35 U/L (ref 11–51)
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
## 2019-08-28 ENCOUNTER — Emergency Department
Admission: EM | Admit: 2019-08-28 | Discharge: 2019-08-29 | Disposition: A | Discharge status: Other Acute Inpatient Hospital | Payer: Self-pay | Attending: Emergency Medicine | Admitting: Emergency Medicine

## 2019-08-28 DIAGNOSIS — Y999 Unspecified external cause status: Secondary | ICD-10-CM | POA: Insufficient documentation

## 2019-08-28 DIAGNOSIS — F17210 Nicotine dependence, cigarettes, uncomplicated: Secondary | ICD-10-CM | POA: Insufficient documentation

## 2019-08-28 DIAGNOSIS — S1093XA Contusion of unspecified part of neck, initial encounter: Secondary | ICD-10-CM

## 2019-08-28 DIAGNOSIS — Y939 Activity, unspecified: Secondary | ICD-10-CM | POA: Insufficient documentation

## 2019-08-28 DIAGNOSIS — S29001A Unspecified injury of muscle and tendon of front wall of thorax, initial encounter: Secondary | ICD-10-CM | POA: Insufficient documentation

## 2019-08-28 DIAGNOSIS — Z20828 Contact with and (suspected) exposure to other viral communicable diseases: Secondary | ICD-10-CM | POA: Insufficient documentation

## 2019-08-28 DIAGNOSIS — S1083XA Contusion of other specified part of neck, initial encounter: Secondary | ICD-10-CM | POA: Insufficient documentation

## 2019-08-28 DIAGNOSIS — S298XXA Other specified injuries of thorax, initial encounter: Secondary | ICD-10-CM | POA: Insufficient documentation

## 2019-08-28 DIAGNOSIS — Y929 Unspecified place or not applicable: Secondary | ICD-10-CM | POA: Insufficient documentation

## 2019-08-28 DIAGNOSIS — Z4659 Encounter for fitting and adjustment of other gastrointestinal appliance and device: Secondary | ICD-10-CM
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
## 2019-08-29 ENCOUNTER — Emergency Department: Payer: Self-pay

## 2019-08-29 LAB — BPAM RBC
Blood Product Expiration Date: 202101292359
Blood Product Expiration Date: 202101292359
ISSUE DATE / TIME: 202012300147
ISSUE DATE / TIME: 202012300147
Unit Type and Rh: 5100
Unit Type and Rh: 5100

## 2019-08-29 LAB — CBC WITH DIFFERENTIAL/PLATELET
Abs Immature Granulocytes: 0.03 10*3/uL (ref 0.00–0.07)
Basophils Absolute: 0.1 10*3/uL (ref 0.0–0.1)
Basophils Relative: 1 %
Eosinophils Absolute: 0.4 10*3/uL (ref 0.0–0.5)
Eosinophils Relative: 3 %
HCT: 43.4 % (ref 39.0–52.0)
Hemoglobin: 14.1 g/dL (ref 13.0–17.0)
Immature Granulocytes: 0 %
Lymphocytes Relative: 22 %
Lymphs Abs: 3.1 10*3/uL (ref 0.7–4.0)
MCH: 28 pg (ref 26.0–34.0)
MCHC: 32.5 g/dL (ref 30.0–36.0)
MCV: 86.3 fL (ref 80.0–100.0)
Monocytes Absolute: 1 10*3/uL (ref 0.1–1.0)
Monocytes Relative: 7 %
Neutro Abs: 9.5 10*3/uL — ABNORMAL HIGH (ref 1.7–7.7)
Neutrophils Relative %: 67 %
Platelets: 327 10*3/uL (ref 150–400)
RBC: 5.03 MIL/uL (ref 4.22–5.81)
RDW: 12.8 % (ref 11.5–15.5)
WBC: 14.1 10*3/uL — ABNORMAL HIGH (ref 4.0–10.5)
nRBC: 0 % (ref 0.0–0.2)

## 2019-08-29 LAB — COMPREHENSIVE METABOLIC PANEL
ALT: 13 U/L (ref 0–44)
AST: 18 U/L (ref 15–41)
Albumin: 4.3 g/dL (ref 3.5–5.0)
Alkaline Phosphatase: 55 U/L (ref 38–126)
Anion gap: 8 (ref 5–15)
BUN: 11 mg/dL (ref 6–20)
CO2: 26 mmol/L (ref 22–32)
Calcium: 9.1 mg/dL (ref 8.9–10.3)
Chloride: 102 mmol/L (ref 98–111)
Creatinine, Ser: 1.16 mg/dL (ref 0.61–1.24)
GFR calc Af Amer: >60 mL/min (ref >60)
GFR calc non Af Amer: >60 mL/min (ref >60)
Glucose, Bld: 123 mg/dL — ABNORMAL HIGH (ref 70–99)
Potassium: 3.3 mmol/L — ABNORMAL LOW (ref 3.5–5.1)
Sodium: 136 mmol/L (ref 135–145)
Total Bilirubin: 0.6 mg/dL (ref 0.3–1.2)
Total Protein: 7.9 g/dL (ref 6.5–8.1)

## 2019-08-29 LAB — TYPE AND SCREEN
ABO/RH(D): B POS
Antibody Screen: NEGATIVE
Unit division: 0
Unit division: 0

## 2019-08-29 LAB — PROTIME-INR
INR: 0.9 (ref 0.8–1.2)
Prothrombin Time: 11.8 seconds (ref 11.4–15.2)

## 2019-08-29 LAB — APTT: aPTT: 30 seconds (ref 24–36)

## 2019-08-29 LAB — PREPARE RBC (CROSSMATCH)

## 2019-08-29 LAB — POC SARS CORONAVIRUS 2 AG: SARS Coronavirus 2 Ag: NEGATIVE

## 2019-08-29 MED ORDER — LACTATED RINGERS IV BOLUS
1000.0000 mL | Freq: Once | INTRAVENOUS | Status: AC
  Administered 2019-08-29: 1000 mL via INTRAVENOUS

## 2019-08-29 MED ORDER — PROPOFOL 100 MG/10ML IV EMUL
0.00 | INTRAVENOUS | Status: DC
Start: ? — End: 2019-08-29

## 2019-08-29 MED ORDER — ENOXAPARIN SODIUM 40 MG/0.4ML ~~LOC~~ SOLN
40.00 | SUBCUTANEOUS | Status: DC
Start: 2019-08-29 — End: 2019-08-29

## 2019-08-29 MED ORDER — GENERIC EXTERNAL MEDICATION
15.00 | Status: DC
Start: ? — End: 2019-08-29

## 2019-08-29 MED ORDER — PROPOFOL 1000 MG/100ML IV EMUL
INTRAVENOUS | Status: AC
  Filled 2019-08-29: qty 100

## 2019-08-29 MED ORDER — MIDAZOLAM HCL 2 MG/2ML IJ SOLN
2.0000 mg | Freq: Once | INTRAMUSCULAR | Status: AC
  Administered 2019-08-29: 2 mg via INTRAVENOUS

## 2019-08-29 MED ORDER — POTASSIUM CHLORIDE 10 MEQ/100ML IV SOLN
10.00 | INTRAVENOUS | Status: DC
Start: ? — End: 2019-08-29

## 2019-08-29 MED ORDER — HYDROMORPHONE HCL 1 MG/ML IJ SOLN
1.00 | INTRAMUSCULAR | Status: DC
Start: ? — End: 2019-08-29

## 2019-08-29 MED ORDER — MAGNESIUM SULFATE 2 GM/50ML IV SOLN
2.00 | INTRAVENOUS | Status: DC
Start: ? — End: 2019-08-29

## 2019-08-29 MED ORDER — GENERIC EXTERNAL MEDICATION
1.00 | Status: DC
Start: ? — End: 2019-08-29

## 2019-08-29 MED ORDER — CEFAZOLIN SODIUM-DEXTROSE 1-4 GM/50ML-% IV SOLN
1.0000 g | Freq: Once | INTRAVENOUS | Status: DC
  Filled 2019-08-29: qty 50

## 2019-08-29 MED ORDER — SUCCINYLCHOLINE CHLORIDE 20 MG/ML IJ SOLN
200.0000 mg | Freq: Once | INTRAMUSCULAR | Status: AC
  Administered 2019-08-29: 200 mg via INTRAVENOUS

## 2019-08-29 MED ORDER — CEFAZOLIN SODIUM-DEXTROSE 1-4 GM/50ML-% IV SOLN: 1.0000 g | Freq: Once | INTRAVENOUS | Status: DC

## 2019-08-29 MED ORDER — SODIUM CHLORIDE 0.9 % IV BOLUS
1000.0000 mL | Freq: Once | INTRAVENOUS | Status: AC
  Administered 2019-08-29: 1000 mL via INTRAVENOUS

## 2019-08-29 MED ORDER — ETOMIDATE 2 MG/ML IV SOLN
20.0000 mg | Freq: Once | INTRAVENOUS | Status: AC
  Administered 2019-08-28: 20 mg via INTRAVENOUS

## 2019-08-29 MED ORDER — GENERIC EXTERNAL MEDICATION
0.00 | Status: DC
Start: ? — End: 2019-08-29

## 2019-08-29 MED ORDER — ONDANSETRON HCL 4 MG/2ML IJ SOLN
4.00 | INTRAMUSCULAR | Status: DC
Start: ? — End: 2019-08-29

## 2019-08-29 MED ORDER — GENERIC EXTERNAL MEDICATION
30.00 | Status: DC
Start: ? — End: 2019-08-29

## 2019-08-29 MED ORDER — TETANUS-DIPHTH-ACELL PERTUSSIS 5-2.5-18.5 LF-MCG/0.5 IM SUSP
0.5000 mL | Freq: Once | INTRAMUSCULAR | Status: DC
  Filled 2019-08-29: qty 0.5

## 2019-08-29 MED ORDER — LACTATED RINGERS IV SOLN
100.00 | INTRAVENOUS | Status: DC
Start: ? — End: 2019-08-29

## 2019-08-29 MED ORDER — GENERIC EXTERNAL MEDICATION
2.00 | Status: DC
Start: ? — End: 2019-08-29

## 2019-08-29 MED ORDER — FENTANYL 2500MCG IN NS 250ML (10MCG/ML) PREMIX INFUSION
40.0000 ug/h | INTRAVENOUS | Status: DC
  Administered 2019-08-29: 40 ug/h via INTRAVENOUS
  Filled 2019-08-29: qty 250

## 2019-08-29 NOTE — ED Notes (Signed)
Patient is unable to sign for consent to transfer due to sedation and intubation.

## 2019-08-29 NOTE — ED Notes (Addendum)
Report given to Jacqlyn Larsen of [employer] and Janace Hoard ER nurse
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
## 2019-08-29 NOTE — ED Notes (Signed)
Pt belongings given to [place] [employer]
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
## 2019-11-30 ENCOUNTER — Emergency Department
Admission: EM | Admit: 2019-11-30 | Discharge: 2019-11-30 | Disposition: A | Discharge status: Home / Self Care | Payer: No Typology Code available for payment source | Attending: Emergency Medicine | Admitting: Emergency Medicine

## 2019-11-30 ENCOUNTER — Other Ambulatory Visit: Payer: Self-pay

## 2019-11-30 ENCOUNTER — Emergency Department: Payer: No Typology Code available for payment source

## 2019-11-30 DIAGNOSIS — Z5321 Procedure and treatment not carried out due to patient leaving prior to being seen by health care provider: Secondary | ICD-10-CM | POA: Insufficient documentation

## 2019-11-30 DIAGNOSIS — R519 Headache, unspecified: Secondary | ICD-10-CM | POA: Insufficient documentation

## 2019-11-30 HISTORY — DX: Accidental discharge from unspecified firearms or gun, initial encounter: W34.00XA
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
## 2019-11-30 NOTE — ED Notes (Signed)
Patient up to stat desk to inform RN that he was leaving as his ride was here. RN informed patient that he was one of the next two patients to be roomed and asked patient to stay. Patient again reported that his ride was already here. RN asked patient if he would like RN to call to get an approximate time before patient might be room; patient responded in the affirmative. RN called charge RN and was informed RN that room would be ready in the next few minutes. Patient informed. Patient indicated guilt that his ride was already here. Charge RN called and informed this RN that patient's room ready. Patient informed. Patient again expressed guilt that his ride was already here. Patient left.

## 2019-12-04 ENCOUNTER — Other Ambulatory Visit: Payer: Self-pay

## 2019-12-04 ENCOUNTER — Encounter: Payer: Self-pay | Admitting: Emergency Medicine

## 2019-12-04 DIAGNOSIS — F17210 Nicotine dependence, cigarettes, uncomplicated: Secondary | ICD-10-CM | POA: Insufficient documentation

## 2019-12-04 DIAGNOSIS — S39012A Strain of muscle, fascia and tendon of lower back, initial encounter: Secondary | ICD-10-CM | POA: Insufficient documentation

## 2019-12-04 DIAGNOSIS — Y999 Unspecified external cause status: Secondary | ICD-10-CM | POA: Insufficient documentation

## 2019-12-04 DIAGNOSIS — Y939 Activity, unspecified: Secondary | ICD-10-CM | POA: Diagnosis not present

## 2019-12-04 DIAGNOSIS — Y92410 Unspecified street and highway as the place of occurrence of the external cause: Secondary | ICD-10-CM | POA: Insufficient documentation

## 2019-12-04 DIAGNOSIS — S3992XA Unspecified injury of lower back, initial encounter: Secondary | ICD-10-CM | POA: Diagnosis present
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
## 2019-12-05 ENCOUNTER — Emergency Department
Admission: EM | Admit: 2019-12-05 | Discharge: 2019-12-05 | Disposition: A | Discharge status: Home / Self Care | Payer: No Typology Code available for payment source | Attending: Emergency Medicine | Admitting: Emergency Medicine

## 2019-12-05 ENCOUNTER — Emergency Department: Payer: No Typology Code available for payment source

## 2019-12-05 DIAGNOSIS — S39012A Strain of muscle, fascia and tendon of lower back, initial encounter: Secondary | ICD-10-CM

## 2019-12-05 MED ORDER — CYCLOBENZAPRINE HCL 10 MG PO TABS
5.0000 mg | ORAL_TABLET | Freq: Once | ORAL | Status: AC
  Administered 2019-12-05: 5 mg via ORAL
  Filled 2019-12-05: qty 1

## 2019-12-05 MED ORDER — IBUPROFEN 800 MG PO TABS: 800.0000 mg | ORAL_TABLET | Freq: Three times a day (TID) | ORAL | 0 refills | Status: DC | PRN

## 2019-12-05 MED ORDER — CYCLOBENZAPRINE HCL 5 MG PO TABS: ORAL_TABLET | ORAL | 0 refills | Status: DC

## 2019-12-05 MED ORDER — IBUPROFEN 800 MG PO TABS
800.0000 mg | ORAL_TABLET | Freq: Once | ORAL | Status: AC
  Administered 2019-12-05: 800 mg via ORAL
  Filled 2019-12-05: qty 1
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
## 2019-12-05 NOTE — ED Notes (Signed)
Pt to Xray.
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
## 2019-12-05 NOTE — Discharge Instructions (Signed)
You may take medicines as needed for pain and muscle spasms (Motrin/Flexeril).  Apply moist heat to affected area several times daily.  Return to the ER for worsening symptoms, persistent vomiting, difficulty breathing or other concerns.

## 2020-04-06 ENCOUNTER — Emergency Department
Admission: EM | Admit: 2020-04-06 | Discharge: 2020-04-06 | Disposition: A | Discharge status: Home / Self Care | Payer: Self-pay | Attending: Emergency Medicine | Admitting: Emergency Medicine

## 2020-04-06 ENCOUNTER — Emergency Department: Payer: Self-pay

## 2020-04-06 ENCOUNTER — Other Ambulatory Visit: Payer: Self-pay

## 2020-04-06 ENCOUNTER — Encounter: Payer: Self-pay | Admitting: Radiology

## 2020-04-06 DIAGNOSIS — M545 Low back pain, unspecified: Secondary | ICD-10-CM

## 2020-04-06 DIAGNOSIS — F17210 Nicotine dependence, cigarettes, uncomplicated: Secondary | ICD-10-CM | POA: Insufficient documentation

## 2020-04-06 MED ORDER — KETOROLAC TROMETHAMINE 30 MG/ML IJ SOLN
30.0000 mg | Freq: Once | INTRAMUSCULAR | Status: AC
  Administered 2020-04-06: 30 mg via INTRAMUSCULAR
  Filled 2020-04-06: qty 1
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
## 2020-04-06 NOTE — ED Notes (Signed)
Pt appears to have left without d/c instructions, vitals. Provider aware
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
## 2020-04-06 NOTE — ED Triage Notes (Signed)
Patient reports have back pain for several months.
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
## 2021-02-13 ENCOUNTER — Other Ambulatory Visit: Payer: Self-pay

## 2021-02-13 ENCOUNTER — Emergency Department
Admission: EM | Admit: 2021-02-13 | Discharge: 2021-02-13 | Disposition: A | Discharge status: Home / Self Care | Payer: Self-pay | Attending: Emergency Medicine | Admitting: Emergency Medicine

## 2021-02-13 ENCOUNTER — Encounter: Payer: Self-pay | Admitting: Emergency Medicine

## 2021-02-13 DIAGNOSIS — W57XXXA Bitten or stung by nonvenomous insect and other nonvenomous arthropods, initial encounter: Secondary | ICD-10-CM | POA: Insufficient documentation

## 2021-02-13 DIAGNOSIS — F17210 Nicotine dependence, cigarettes, uncomplicated: Secondary | ICD-10-CM | POA: Insufficient documentation

## 2021-02-13 DIAGNOSIS — S30860A Insect bite (nonvenomous) of lower back and pelvis, initial encounter: Secondary | ICD-10-CM | POA: Insufficient documentation

## 2021-02-13 DIAGNOSIS — R519 Headache, unspecified: Secondary | ICD-10-CM | POA: Insufficient documentation

## 2021-02-13 NOTE — Discharge Instructions (Addendum)
You may alternate Tylenol 1000 mg every 6 hours as needed for pain, fever and Ibuprofen 800 mg every 8 hours as needed for pain, fever.  Please take Ibuprofen with food.  Do not take more than 4000 mg of Tylenol (acetaminophen) in a 24 hour period.  You may take over-the-counter Benadryl 25 to 50 mg every 8 hours as needed for itching.

## 2021-02-13 NOTE — ED Notes (Signed)
ED Provider at bedside. 

## 2021-02-13 NOTE — ED Triage Notes (Signed)
EMS brings pt in from home for insect bite; pt indicates small red area to left side lower back; denies pain or itching to area
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
## 2021-07-14 ENCOUNTER — Emergency Department: Payer: Self-pay

## 2021-07-14 ENCOUNTER — Other Ambulatory Visit: Payer: Self-pay

## 2021-07-14 ENCOUNTER — Encounter: Payer: Self-pay | Admitting: *Deleted

## 2021-07-14 ENCOUNTER — Emergency Department
Admission: EM | Admit: 2021-07-14 | Discharge: 2021-07-14 | Disposition: A | Discharge status: Home / Self Care | Payer: Self-pay | Attending: Emergency Medicine | Admitting: Emergency Medicine

## 2021-07-14 DIAGNOSIS — M79672 Pain in left foot: Secondary | ICD-10-CM | POA: Insufficient documentation

## 2021-07-14 DIAGNOSIS — F17210 Nicotine dependence, cigarettes, uncomplicated: Secondary | ICD-10-CM | POA: Insufficient documentation

## 2021-07-14 DIAGNOSIS — R252 Cramp and spasm: Secondary | ICD-10-CM

## 2021-07-14 DIAGNOSIS — M62838 Other muscle spasm: Secondary | ICD-10-CM | POA: Insufficient documentation

## 2021-07-14 DIAGNOSIS — Z5321 Procedure and treatment not carried out due to patient leaving prior to being seen by health care provider: Secondary | ICD-10-CM | POA: Insufficient documentation

## 2021-07-14 LAB — BASIC METABOLIC PANEL
Anion gap: 6 (ref 5–15)
BUN: 17 mg/dL (ref 6–20)
CO2: 28 mmol/L (ref 22–32)
Calcium: 8.1 mg/dL — ABNORMAL LOW (ref 8.9–10.3)
Chloride: 104 mmol/L (ref 98–111)
Creatinine, Ser: 1.42 mg/dL — ABNORMAL HIGH (ref 0.61–1.24)
GFR, Estimated: >60 mL/min (ref >60)
Glucose, Bld: 104 mg/dL — ABNORMAL HIGH (ref 70–99)
Potassium: 3.4 mmol/L — ABNORMAL LOW (ref 3.5–5.1)
Sodium: 138 mmol/L (ref 135–145)

## 2021-07-14 LAB — CBC
HCT: 43.4 % (ref 39.0–52.0)
Hemoglobin: 14.3 g/dL (ref 13.0–17.0)
MCH: 28.7 pg (ref 26.0–34.0)
MCHC: 32.9 g/dL (ref 30.0–36.0)
MCV: 87.1 fL (ref 80.0–100.0)
Platelets: 275 10*3/uL (ref 150–400)
RBC: 4.98 MIL/uL (ref 4.22–5.81)
RDW: 13 % (ref 11.5–15.5)
WBC: 12 10*3/uL — ABNORMAL HIGH (ref 4.0–10.5)
nRBC: 0 % (ref 0.0–0.2)

## 2021-07-14 MED ORDER — BACLOFEN 10 MG PO TABS: 10.0000 mg | ORAL_TABLET | Freq: Three times a day (TID) | ORAL | 0 refills | Status: AC

## 2021-07-14 MED ORDER — MELOXICAM 15 MG PO TABS: 15.0000 mg | ORAL_TABLET | Freq: Every day | ORAL | 2 refills | Status: AC

## 2021-07-14 NOTE — ED Notes (Signed)
See triage note  presents with spasm like pain to both thighs  states he has had pain to left foot for awhile  but the spasms are new over the past few days
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
## 2021-07-14 NOTE — ED Triage Notes (Signed)
Pt reports bilateral lower extremity muscle spasms intermittently throughout the year with left sided foot pain. Spasms are worse when laying down.

## 2021-07-14 NOTE — Discharge Instructions (Signed)
Take an over-the-counter daily vitamin to help boost your calcium, vitamin D and magnesium.  This will help with your muscle cramps and your foot pain.  follow-up with orthopedics if not improving in 1 week.  Return emergency department worsening.
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
## 2021-08-27 ENCOUNTER — Other Ambulatory Visit: Payer: Self-pay

## 2021-08-27 ENCOUNTER — Emergency Department
Admission: EM | Admit: 2021-08-27 | Discharge: 2021-08-27 | Disposition: A | Discharge status: Home / Self Care | Payer: Self-pay | Attending: Emergency Medicine | Admitting: Emergency Medicine

## 2021-08-27 ENCOUNTER — Encounter: Payer: Self-pay | Admitting: Emergency Medicine

## 2021-08-27 DIAGNOSIS — F17210 Nicotine dependence, cigarettes, uncomplicated: Secondary | ICD-10-CM | POA: Insufficient documentation

## 2021-08-27 DIAGNOSIS — J069 Acute upper respiratory infection, unspecified: Secondary | ICD-10-CM | POA: Insufficient documentation

## 2021-08-27 DIAGNOSIS — Z20822 Contact with and (suspected) exposure to covid-19: Secondary | ICD-10-CM | POA: Insufficient documentation

## 2021-08-27 LAB — RESP PANEL BY RT-PCR (FLU A&B, COVID) ARPGX2
Influenza A by PCR: NEGATIVE
Influenza B by PCR: NEGATIVE
SARS Coronavirus 2 by RT PCR: NEGATIVE

## 2021-08-27 NOTE — Discharge Instructions (Signed)
Follow-up with your regular doctor if not improving in 3 days.  Return emergency department worsening.  Take over-the-counter Mucinex if needed for cough.  If you develop additional fever take Tylenol or ibuprofen
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
## 2021-08-27 NOTE — ED Triage Notes (Signed)
Patient ambulatory to triage with steady gait, without difficulty or distress noted; pt reports since Christmas having chills & body aches; denies any other accomp symptoms

## 2022-04-25 IMAGING — DX DG FOOT COMPLETE 3+V*L*
3 series · 3 of 3 positions shown · non-contrast
Comparison: None.

CLINICAL DATA: Left foot pain.  Concern for a stress fracture.

EXAM:
LEFT FOOT - COMPLETE 3+ VIEW

[foot ap]
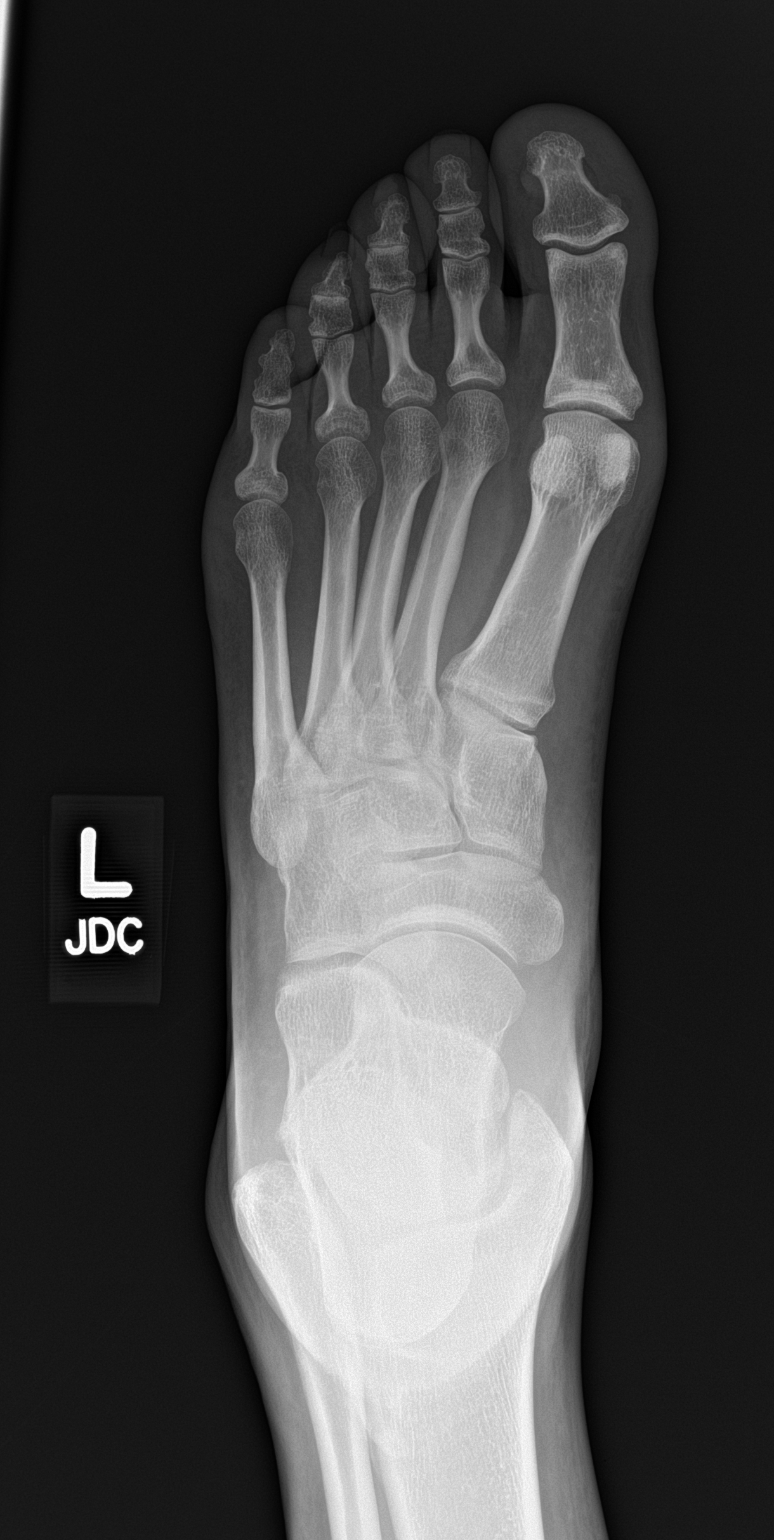

[foot obl]
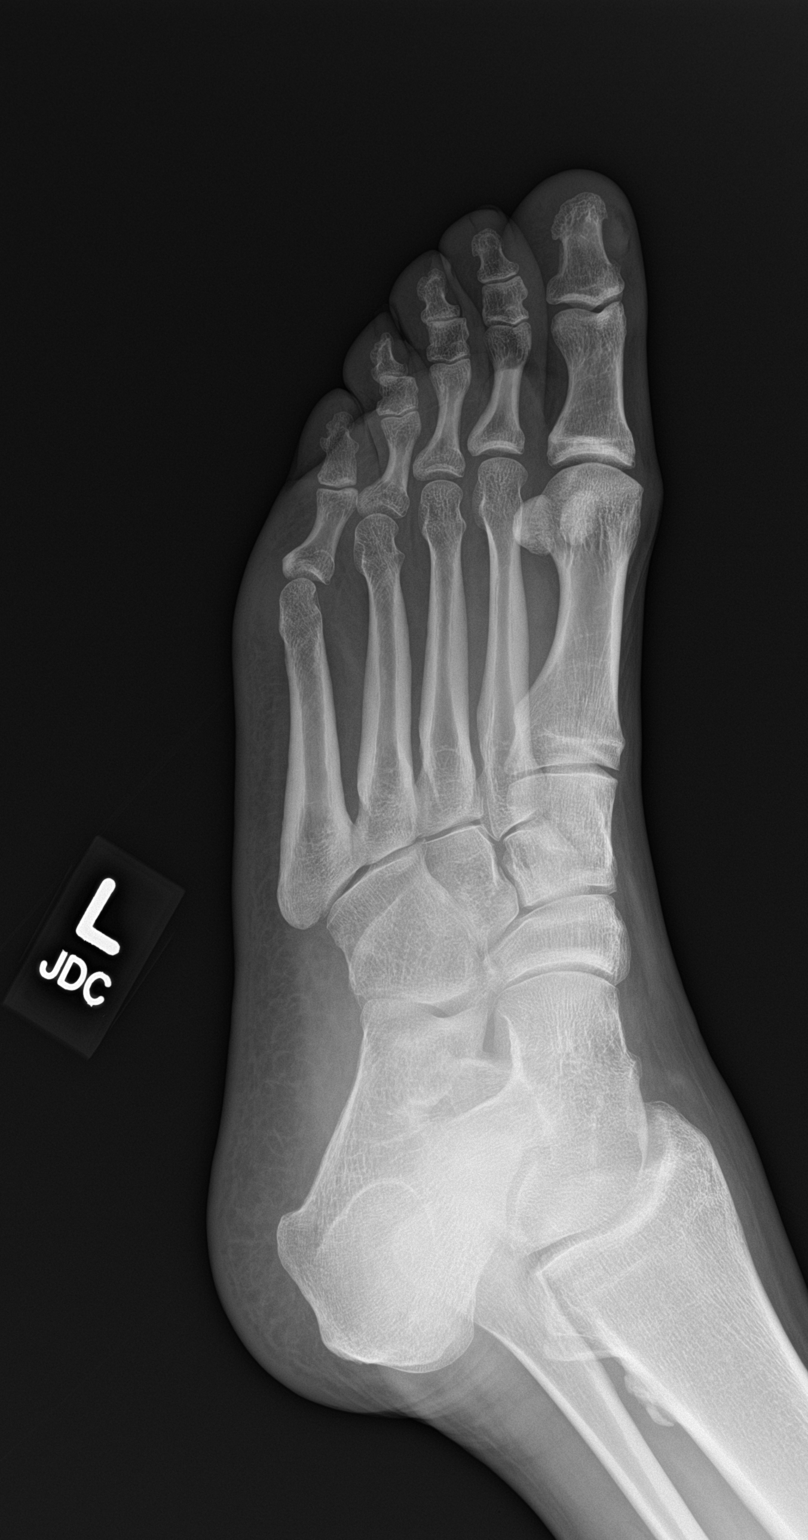

[foot lat]
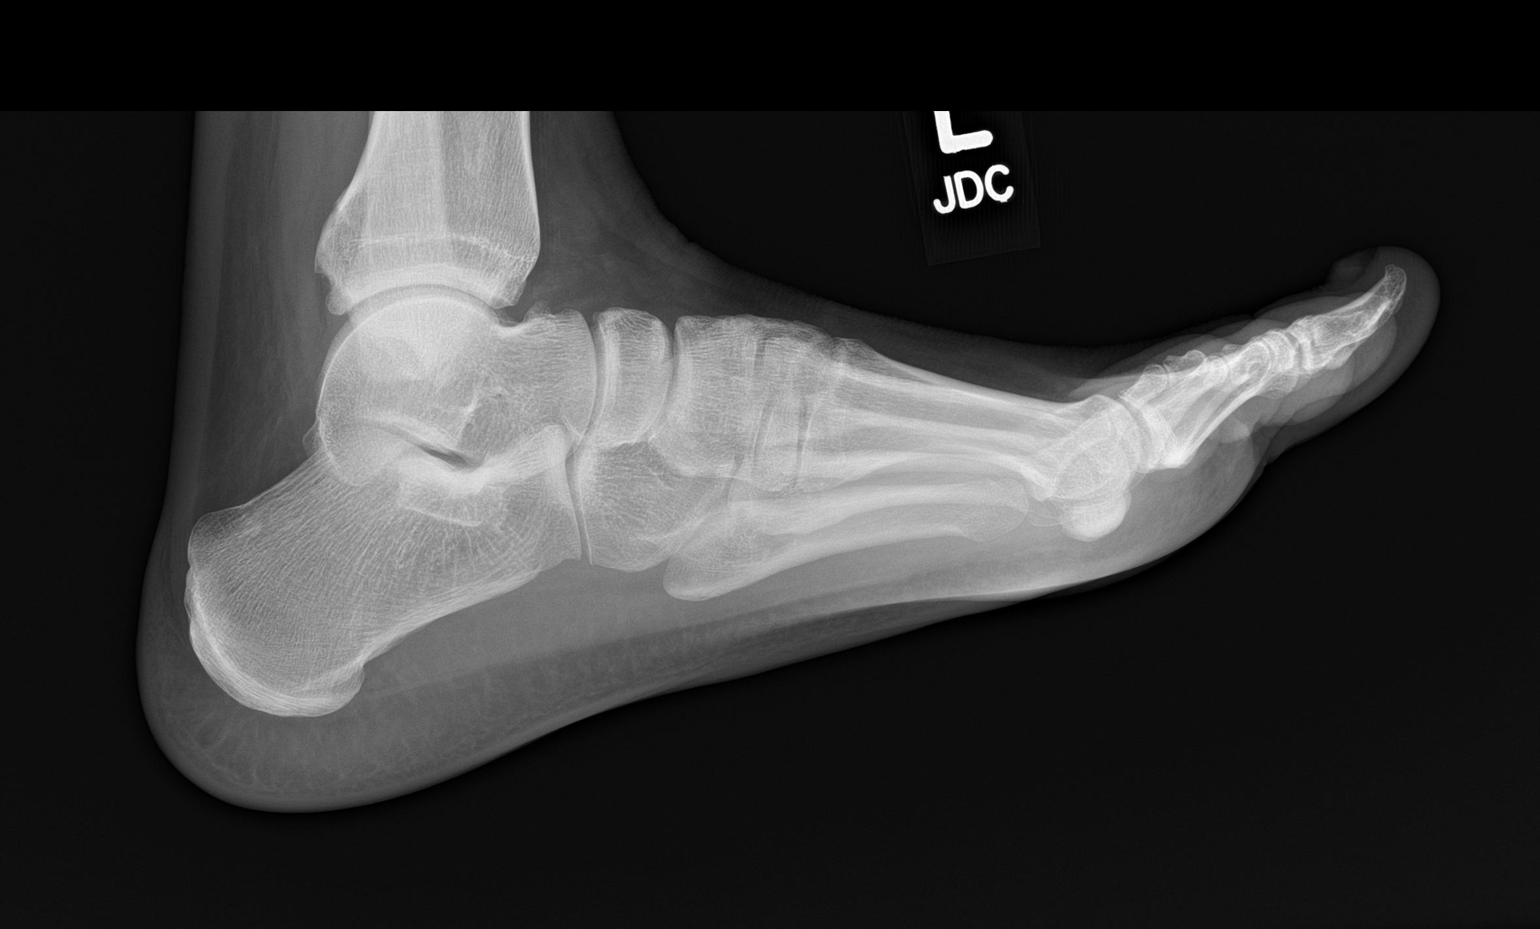

[3 of 3 positions shown; findings below may reference images not displayed]

FINDINGS: No evidence for fracture or dislocation. No findings that would be
suggestive for a stress fracture. Mild subchondral sclerosis
involving the base of the great toe proximal phalanx. Suspect this
represents mild degenerative changes. Alignment of left foot is
within normal limits. No focal soft tissue abnormality.
IMPRESSION: 1. No acute abnormality.
2. Mild degenerative changes at the first MTP joint.

## 2023-01-17 DIAGNOSIS — F1110 Opioid abuse, uncomplicated: Secondary | ICD-10-CM | POA: Diagnosis not present

## 2023-01-17 DIAGNOSIS — F1120 Opioid dependence, uncomplicated: Secondary | ICD-10-CM | POA: Diagnosis not present

## 2023-03-04 DIAGNOSIS — F1120 Opioid dependence, uncomplicated: Secondary | ICD-10-CM | POA: Diagnosis not present

## 2023-03-04 DIAGNOSIS — F1420 Cocaine dependence, uncomplicated: Secondary | ICD-10-CM | POA: Diagnosis not present

## 2023-03-04 DIAGNOSIS — F17210 Nicotine dependence, cigarettes, uncomplicated: Secondary | ICD-10-CM | POA: Diagnosis not present

## 2023-03-11 DIAGNOSIS — F1120 Opioid dependence, uncomplicated: Secondary | ICD-10-CM | POA: Diagnosis not present

## 2023-03-11 DIAGNOSIS — F17210 Nicotine dependence, cigarettes, uncomplicated: Secondary | ICD-10-CM | POA: Diagnosis not present

## 2023-03-11 DIAGNOSIS — F1410 Cocaine abuse, uncomplicated: Secondary | ICD-10-CM | POA: Diagnosis not present

## 2023-03-25 DIAGNOSIS — F17210 Nicotine dependence, cigarettes, uncomplicated: Secondary | ICD-10-CM | POA: Diagnosis not present

## 2023-03-25 DIAGNOSIS — F1120 Opioid dependence, uncomplicated: Secondary | ICD-10-CM | POA: Diagnosis not present

## 2023-03-25 DIAGNOSIS — F1410 Cocaine abuse, uncomplicated: Secondary | ICD-10-CM | POA: Diagnosis not present

## 2023-04-01 DIAGNOSIS — F1410 Cocaine abuse, uncomplicated: Secondary | ICD-10-CM | POA: Diagnosis not present

## 2023-04-01 DIAGNOSIS — F1120 Opioid dependence, uncomplicated: Secondary | ICD-10-CM | POA: Diagnosis not present

## 2023-04-01 DIAGNOSIS — F17210 Nicotine dependence, cigarettes, uncomplicated: Secondary | ICD-10-CM | POA: Diagnosis not present

## 2023-04-19 DIAGNOSIS — Z1331 Encounter for screening for depression: Secondary | ICD-10-CM | POA: Diagnosis not present

## 2023-04-19 DIAGNOSIS — F1120 Opioid dependence, uncomplicated: Secondary | ICD-10-CM | POA: Diagnosis not present

## 2023-04-19 DIAGNOSIS — F17210 Nicotine dependence, cigarettes, uncomplicated: Secondary | ICD-10-CM | POA: Diagnosis not present

## 2024-10-05 ENCOUNTER — Ambulatory Visit: Payer: MEDICAID | Admitting: Cardiology

## 2024-10-09 ENCOUNTER — Ambulatory Visit: Payer: MEDICAID | Admitting: Cardiology
